# Patient Record
Sex: Male | Born: 1975 | Race: Black or African American | Hispanic: No | Smoking: Never smoker
Health system: Southern US, Community
[De-identification: ages and names within clinical notes are randomized; demographics above are authoritative.]

## PROBLEM LIST (undated history)

## (undated) DIAGNOSIS — S62101A Fracture of unspecified carpal bone, right wrist, initial encounter for closed fracture: Secondary | ICD-10-CM

## (undated) DIAGNOSIS — J309 Allergic rhinitis, unspecified: Secondary | ICD-10-CM

## (undated) HISTORY — PX: MANDIBLE FRACTURE SURGERY: SHX706

---

## 2014-03-19 ENCOUNTER — Emergency Department (HOSPITAL_COMMUNITY): Payer: Self-pay

## 2014-03-19 ENCOUNTER — Emergency Department (HOSPITAL_COMMUNITY): Payer: Medicaid Other | Admitting: Anesthesiology

## 2014-03-19 ENCOUNTER — Inpatient Hospital Stay (HOSPITAL_COMMUNITY)
Admission: EM | Admit: 2014-03-19 | Discharge: 2014-03-25 | DRG: 482 | Disposition: A | Discharge status: Home Health | Payer: Medicaid Other | Attending: Orthopedic Surgery | Admitting: Orthopedic Surgery

## 2014-03-19 ENCOUNTER — Emergency Department (HOSPITAL_COMMUNITY): Payer: Medicaid Other

## 2014-03-19 ENCOUNTER — Encounter (HOSPITAL_COMMUNITY)
Admission: EM | Disposition: A | Discharge status: Home Health | Payer: Self-pay | Source: Home / Self Care | Attending: Orthopedic Surgery

## 2014-03-19 ENCOUNTER — Encounter (HOSPITAL_COMMUNITY): Payer: Medicaid Other | Admitting: Anesthesiology

## 2014-03-19 ENCOUNTER — Encounter (HOSPITAL_COMMUNITY): Payer: Self-pay | Admitting: Emergency Medicine

## 2014-03-19 DIAGNOSIS — S7291XA Unspecified fracture of right femur, initial encounter for closed fracture: Secondary | ICD-10-CM

## 2014-03-19 DIAGNOSIS — S72409A Unspecified fracture of lower end of unspecified femur, initial encounter for closed fracture: Secondary | ICD-10-CM | POA: Diagnosis present

## 2014-03-19 DIAGNOSIS — S72453A Displaced supracondylar fracture without intracondylar extension of lower end of unspecified femur, initial encounter for closed fracture: Secondary | ICD-10-CM | POA: Diagnosis present

## 2014-03-19 DIAGNOSIS — S7290XA Unspecified fracture of unspecified femur, initial encounter for closed fracture: Secondary | ICD-10-CM | POA: Diagnosis present

## 2014-03-19 DIAGNOSIS — Y9269 Other specified industrial and construction area as the place of occurrence of the external cause: Secondary | ICD-10-CM | POA: Diagnosis not present

## 2014-03-19 DIAGNOSIS — S7710XA Crushing injury of unspecified thigh, initial encounter: Secondary | ICD-10-CM

## 2014-03-19 DIAGNOSIS — S8700XA Crushing injury of unspecified knee, initial encounter: Secondary | ICD-10-CM

## 2014-03-19 DIAGNOSIS — W230XXA Caught, crushed, jammed, or pinched between moving objects, initial encounter: Secondary | ICD-10-CM | POA: Diagnosis present

## 2014-03-19 DIAGNOSIS — S7292XA Unspecified fracture of left femur, initial encounter for closed fracture: Secondary | ICD-10-CM

## 2014-03-19 HISTORY — DX: Allergic rhinitis, unspecified: J30.9

## 2014-03-19 HISTORY — PX: FEMUR IM NAIL: SHX1597

## 2014-03-19 HISTORY — DX: Fracture of unspecified carpal bone, right wrist, initial encounter for closed fracture: S62.101A

## 2014-03-19 LAB — URINALYSIS, ROUTINE W REFLEX MICROSCOPIC
Bilirubin Urine: NEGATIVE
Glucose, UA: NEGATIVE mg/dL
Hgb urine dipstick: NEGATIVE
KETONES UR: 15 mg/dL — AB
NITRITE: NEGATIVE
PH: 5 (ref 5.0–8.0)
PROTEIN: NEGATIVE mg/dL
Specific Gravity, Urine: 1.028 (ref 1.005–1.030)
UROBILINOGEN UA: 0.2 mg/dL (ref 0.0–1.0)

## 2014-03-19 LAB — ABO/RH: ABO/RH(D): A POS

## 2014-03-19 LAB — URINE MICROSCOPIC-ADD ON

## 2014-03-19 LAB — COMPREHENSIVE METABOLIC PANEL
ALT: 33 U/L (ref 0–53)
ANION GAP: 22 — AB (ref 5–15)
AST: 28 U/L (ref 0–37)
Albumin: 4 g/dL (ref 3.5–5.2)
Alkaline Phosphatase: 60 U/L (ref 39–117)
BUN: 11 mg/dL (ref 6–23)
CALCIUM: 9.4 mg/dL (ref 8.4–10.5)
CO2: 17 meq/L — AB (ref 19–32)
Chloride: 100 mEq/L (ref 96–112)
Creatinine, Ser: 1.06 mg/dL (ref 0.50–1.35)
GFR calc Af Amer: >90 mL/min (ref >90)
GFR, EST NON AFRICAN AMERICAN: 87 mL/min — AB (ref >90)
Glucose, Bld: 142 mg/dL — ABNORMAL HIGH (ref 70–99)
Potassium: 3.1 mEq/L — ABNORMAL LOW (ref 3.7–5.3)
Sodium: 139 mEq/L (ref 137–147)
Total Bilirubin: 0.5 mg/dL (ref 0.3–1.2)
Total Protein: 7.6 g/dL (ref 6.0–8.3)

## 2014-03-19 LAB — CDS SEROLOGY

## 2014-03-19 LAB — CBC
HCT: 41.5 % (ref 39.0–52.0)
Hemoglobin: 15 g/dL (ref 13.0–17.0)
MCH: 31.3 pg (ref 26.0–34.0)
MCHC: 36.1 g/dL — ABNORMAL HIGH (ref 30.0–36.0)
MCV: 86.5 fL (ref 78.0–100.0)
PLATELETS: 265 10*3/uL (ref 150–400)
RBC: 4.8 MIL/uL (ref 4.22–5.81)
RDW: 13.2 % (ref 11.5–15.5)
WBC: 8.9 10*3/uL (ref 4.0–10.5)

## 2014-03-19 LAB — PROTIME-INR
INR: 1.06 (ref 0.00–1.49)
PROTHROMBIN TIME: 13.8 s (ref 11.6–15.2)

## 2014-03-19 LAB — TYPE AND SCREEN
ABO/RH(D): A POS
Antibody Screen: NEGATIVE

## 2014-03-19 LAB — ETHANOL: Alcohol, Ethyl (B): <11 mg/dL (ref 0–11)

## 2014-03-19 LAB — GLUCOSE, CAPILLARY: Glucose-Capillary: 119 mg/dL — ABNORMAL HIGH (ref 70–99)

## 2014-03-19 SURGERY — INSERTION, INTRAMEDULLARY ROD, FEMUR, RETROGRADE
Anesthesia: General | Site: Leg Upper | Laterality: Bilateral

## 2014-03-19 MED ORDER — ARTIFICIAL TEARS OP OINT
TOPICAL_OINTMENT | OPHTHALMIC | Status: DC | PRN
  Administered 2014-03-19: 1 via OPHTHALMIC

## 2014-03-19 MED ORDER — MEPERIDINE HCL 25 MG/ML IJ SOLN: 6.2500 mg | INTRAMUSCULAR | Status: DC | PRN

## 2014-03-19 MED ORDER — GLYCOPYRROLATE 0.2 MG/ML IJ SOLN
INTRAMUSCULAR | Status: DC | PRN
Start: 2014-03-19 — End: 2014-03-19
  Administered 2014-03-19: .8 mg via INTRAVENOUS

## 2014-03-19 MED ORDER — FENTANYL CITRATE 0.05 MG/ML IJ SOLN: 100.0000 ug | Freq: Once | INTRAMUSCULAR | Status: DC

## 2014-03-19 MED ORDER — FENTANYL CITRATE 0.05 MG/ML IJ SOLN
INTRAMUSCULAR | Status: AC
  Filled 2014-03-19: qty 5

## 2014-03-19 MED ORDER — ONDANSETRON HCL 4 MG/2ML IJ SOLN
INTRAMUSCULAR | Status: DC | PRN
  Administered 2014-03-19: 4 mg via INTRAVENOUS

## 2014-03-19 MED ORDER — MORPHINE SULFATE 2 MG/ML IJ SOLN
INTRAMUSCULAR | Status: AC
  Filled 2014-03-19: qty 2

## 2014-03-19 MED ORDER — MIDAZOLAM HCL 5 MG/5ML IJ SOLN
INTRAMUSCULAR | Status: DC | PRN
  Administered 2014-03-19: 2 mg via INTRAVENOUS

## 2014-03-19 MED ORDER — ROCURONIUM BROMIDE 50 MG/5ML IV SOLN
INTRAVENOUS | Status: AC
  Filled 2014-03-19: qty 1

## 2014-03-19 MED ORDER — ONDANSETRON HCL 4 MG/2ML IJ SOLN
INTRAMUSCULAR | Status: AC
  Filled 2014-03-19: qty 2

## 2014-03-19 MED ORDER — ROCURONIUM BROMIDE 50 MG/5ML IV SOLN
INTRAVENOUS | Status: AC
  Filled 2014-03-19: qty 2

## 2014-03-19 MED ORDER — ONDANSETRON HCL 4 MG PO TABS: 4.0000 mg | ORAL_TABLET | Freq: Four times a day (QID) | ORAL | Status: DC | PRN

## 2014-03-19 MED ORDER — SENNA 8.6 MG PO TABS
1.0000 | ORAL_TABLET | Freq: Two times a day (BID) | ORAL | Status: DC
  Administered 2014-03-19 – 2014-03-23 (×9): 8.6 mg via ORAL
  Filled 2014-03-19 (×11): qty 1

## 2014-03-19 MED ORDER — DOCUSATE SODIUM 100 MG PO CAPS
100.0000 mg | ORAL_CAPSULE | Freq: Two times a day (BID) | ORAL | Status: DC
  Administered 2014-03-19 – 2014-03-25 (×12): 100 mg via ORAL
  Filled 2014-03-19 (×13): qty 1

## 2014-03-19 MED ORDER — HYDROMORPHONE HCL PF 1 MG/ML IJ SOLN: 0.2500 mg | INTRAMUSCULAR | Status: DC | PRN

## 2014-03-19 MED ORDER — PROPOFOL 10 MG/ML IV BOLUS
INTRAVENOUS | Status: AC
  Filled 2014-03-19: qty 20

## 2014-03-19 MED ORDER — MIDAZOLAM HCL 2 MG/2ML IJ SOLN
INTRAMUSCULAR | Status: AC
  Filled 2014-03-19: qty 2

## 2014-03-19 MED ORDER — 0.9 % SODIUM CHLORIDE (POUR BTL) OPTIME
TOPICAL | Status: DC | PRN
  Administered 2014-03-19: 1000 mL

## 2014-03-19 MED ORDER — MORPHINE SULFATE 2 MG/ML IJ SOLN
INTRAMUSCULAR | Status: AC
  Filled 2014-03-19: qty 1

## 2014-03-19 MED ORDER — OXYCODONE HCL 5 MG PO TABS: 5.0000 mg | ORAL_TABLET | Freq: Once | ORAL | Status: DC | PRN

## 2014-03-19 MED ORDER — ENOXAPARIN SODIUM 40 MG/0.4ML ~~LOC~~ SOLN
40.0000 mg | SUBCUTANEOUS | Status: DC
  Administered 2014-03-20 – 2014-03-25 (×6): 40 mg via SUBCUTANEOUS
  Filled 2014-03-19 (×8): qty 0.4

## 2014-03-19 MED ORDER — FENTANYL CITRATE 0.05 MG/ML IJ SOLN
50.0000 ug | Freq: Once | INTRAMUSCULAR | Status: AC
  Administered 2014-03-19: 50 ug via INTRAVENOUS
  Filled 2014-03-19: qty 1

## 2014-03-19 MED ORDER — FENTANYL CITRATE 0.05 MG/ML IJ SOLN
50.0000 ug | Freq: Once | INTRAMUSCULAR | Status: AC
  Administered 2014-03-19: 50 ug via INTRAVENOUS

## 2014-03-19 MED ORDER — OXYCODONE HCL 5 MG/5ML PO SOLN: 5.0000 mg | Freq: Once | ORAL | Status: DC | PRN

## 2014-03-19 MED ORDER — HYDROMORPHONE HCL PF 1 MG/ML IJ SOLN
0.5000 mg | INTRAMUSCULAR | Status: DC | PRN
  Administered 2014-03-19 – 2014-03-24 (×19): 1 mg via INTRAVENOUS
  Filled 2014-03-19 (×19): qty 1

## 2014-03-19 MED ORDER — CEFAZOLIN SODIUM-DEXTROSE 2-3 GM-% IV SOLR
INTRAVENOUS | Status: AC
  Filled 2014-03-19: qty 50

## 2014-03-19 MED ORDER — INSULIN ASPART 100 UNIT/ML ~~LOC~~ SOLN: 0.0000 [IU] | Freq: Three times a day (TID) | SUBCUTANEOUS | Status: DC

## 2014-03-19 MED ORDER — MORPHINE SULFATE 2 MG/ML IJ SOLN
INTRAMUSCULAR | Status: AC | PRN
  Administered 2014-03-19: 4 mg via INTRAVENOUS
  Administered 2014-03-19: 2 mg via INTRAVENOUS
  Administered 2014-03-19 (×3): 4 mg via INTRAVENOUS

## 2014-03-19 MED ORDER — DEXAMETHASONE SODIUM PHOSPHATE 4 MG/ML IJ SOLN
INTRAMUSCULAR | Status: DC | PRN
  Administered 2014-03-19: 4 mg via INTRAVENOUS

## 2014-03-19 MED ORDER — FENTANYL CITRATE 0.05 MG/ML IJ SOLN
INTRAMUSCULAR | Status: AC
Start: 2014-03-19 — End: 2014-03-19
  Filled 2014-03-19: qty 5

## 2014-03-19 MED ORDER — SUCCINYLCHOLINE CHLORIDE 20 MG/ML IJ SOLN
INTRAMUSCULAR | Status: DC | PRN
  Administered 2014-03-19: 140 mg via INTRAVENOUS

## 2014-03-19 MED ORDER — ONDANSETRON HCL 4 MG/2ML IJ SOLN
4.0000 mg | Freq: Four times a day (QID) | INTRAMUSCULAR | Status: DC | PRN
Start: 2014-03-19 — End: 2014-03-25

## 2014-03-19 MED ORDER — ROCURONIUM BROMIDE 100 MG/10ML IV SOLN
INTRAVENOUS | Status: DC | PRN
  Administered 2014-03-19: 10 mg via INTRAVENOUS
  Administered 2014-03-19: 50 mg via INTRAVENOUS
  Administered 2014-03-19: 10 mg via INTRAVENOUS
  Administered 2014-03-19: 50 mg via INTRAVENOUS

## 2014-03-19 MED ORDER — LACTATED RINGERS IV SOLN
INTRAVENOUS | Status: DC
  Administered 2014-03-19: 14:00:00 via INTRAVENOUS

## 2014-03-19 MED ORDER — LACTATED RINGERS IV SOLN
INTRAVENOUS | Status: DC | PRN
  Administered 2014-03-19 (×3): via INTRAVENOUS

## 2014-03-19 MED ORDER — PROMETHAZINE HCL 25 MG/ML IJ SOLN
6.2500 mg | INTRAMUSCULAR | Status: DC | PRN
  Administered 2014-03-19: 6.25 mg via INTRAVENOUS

## 2014-03-19 MED ORDER — SODIUM CHLORIDE 0.9 % IV SOLN
INTRAVENOUS | Status: DC
  Administered 2014-03-19 – 2014-03-20 (×2): via INTRAVENOUS

## 2014-03-19 MED ORDER — HYDROMORPHONE HCL PF 1 MG/ML IJ SOLN
INTRAMUSCULAR | Status: AC
  Filled 2014-03-19: qty 1

## 2014-03-19 MED ORDER — NEOSTIGMINE METHYLSULFATE 10 MG/10ML IV SOLN
INTRAVENOUS | Status: DC | PRN
  Administered 2014-03-19: 5 mg via INTRAVENOUS

## 2014-03-19 MED ORDER — PROPOFOL 10 MG/ML IV BOLUS
INTRAVENOUS | Status: DC | PRN
  Administered 2014-03-19: 160 mg via INTRAVENOUS

## 2014-03-19 MED ORDER — CEFAZOLIN SODIUM-DEXTROSE 2-3 GM-% IV SOLR
INTRAVENOUS | Status: DC | PRN
  Administered 2014-03-19: 2 g via INTRAVENOUS

## 2014-03-19 MED ORDER — LIDOCAINE HCL (CARDIAC) 20 MG/ML IV SOLN
INTRAVENOUS | Status: DC | PRN
  Administered 2014-03-19: 60 mg via INTRAVENOUS

## 2014-03-19 MED ORDER — STERILE WATER FOR IRRIGATION IR SOLN
Status: DC | PRN
  Administered 2014-03-19: 1000 mL

## 2014-03-19 MED ORDER — SODIUM CHLORIDE 0.9 % IV BOLUS (SEPSIS)
125.0000 mL | Freq: Once | INTRAVENOUS | Status: AC
  Administered 2014-03-19: 125 mL via INTRAVENOUS

## 2014-03-19 MED ORDER — PHENYLEPHRINE HCL 10 MG/ML IJ SOLN
10.0000 mg | INTRAVENOUS | Status: DC | PRN
  Administered 2014-03-19: 15 ug/min via INTRAVENOUS

## 2014-03-19 MED ORDER — ONDANSETRON HCL 4 MG/2ML IJ SOLN
4.0000 mg | Freq: Once | INTRAMUSCULAR | Status: AC
  Administered 2014-03-19: 4 mg via INTRAVENOUS

## 2014-03-19 MED ORDER — PROMETHAZINE HCL 25 MG/ML IJ SOLN
INTRAMUSCULAR | Status: AC
  Filled 2014-03-19: qty 1

## 2014-03-19 MED ORDER — METOCLOPRAMIDE HCL 10 MG PO TABS: 5.0000 mg | ORAL_TABLET | Freq: Three times a day (TID) | ORAL | Status: DC | PRN

## 2014-03-19 MED ORDER — PHENYLEPHRINE HCL 10 MG/ML IJ SOLN
INTRAMUSCULAR | Status: DC | PRN
  Administered 2014-03-19: 160 ug via INTRAVENOUS
  Administered 2014-03-19 (×2): 120 ug via INTRAVENOUS

## 2014-03-19 MED ORDER — OXYCODONE HCL 5 MG PO TABS
5.0000 mg | ORAL_TABLET | ORAL | Status: DC | PRN
  Administered 2014-03-20 – 2014-03-25 (×24): 10 mg via ORAL
  Filled 2014-03-19 (×25): qty 2

## 2014-03-19 MED ORDER — FENTANYL CITRATE 0.05 MG/ML IJ SOLN
INTRAMUSCULAR | Status: DC | PRN
  Administered 2014-03-19: 100 ug via INTRAVENOUS
  Administered 2014-03-19: 50 ug via INTRAVENOUS
  Administered 2014-03-19: 100 ug via INTRAVENOUS
  Administered 2014-03-19: 50 ug via INTRAVENOUS

## 2014-03-19 MED ORDER — MIDAZOLAM HCL 2 MG/2ML IJ SOLN: 0.5000 mg | Freq: Once | INTRAMUSCULAR | Status: DC | PRN

## 2014-03-19 MED ORDER — METOCLOPRAMIDE HCL 5 MG/ML IJ SOLN: 5.0000 mg | Freq: Three times a day (TID) | INTRAMUSCULAR | Status: DC | PRN

## 2014-03-19 MED ORDER — FENTANYL CITRATE 0.05 MG/ML IJ SOLN
INTRAMUSCULAR | Status: AC
  Filled 2014-03-19: qty 2

## 2014-03-19 SURGICAL SUPPLY — 61 items
BANDAGE ELASTIC 4 VELCRO ST LF (GAUZE/BANDAGES/DRESSINGS) IMPLANT
BANDAGE ESMARK 6X9 LF (GAUZE/BANDAGES/DRESSINGS) IMPLANT
BIT DRILL CALIBRATED 4.3MMX365 (DRILL) ×1 IMPLANT
BIT DRILL CROWE PNT TWST 4.5MM (DRILL) ×1 IMPLANT
BNDG COHESIVE 4X5 TAN STRL (GAUZE/BANDAGES/DRESSINGS) IMPLANT
BNDG COHESIVE 6X5 TAN STRL LF (GAUZE/BANDAGES/DRESSINGS) IMPLANT
BNDG ELASTIC 6X10 VLCR STRL LF (GAUZE/BANDAGES/DRESSINGS) ×6 IMPLANT
BNDG ESMARK 6X9 LF (GAUZE/BANDAGES/DRESSINGS)
CUFF TOURNIQUET SINGLE 44IN (TOURNIQUET CUFF) IMPLANT
DRAPE C-ARM 42X72 X-RAY (DRAPES) ×3 IMPLANT
DRAPE INCISE IOBAN 66X45 STRL (DRAPES) ×3 IMPLANT
DRAPE ORTHO SPLIT 77X108 STRL (DRAPES) ×4
DRAPE SURG ORHT 6 SPLT 77X108 (DRAPES) ×2 IMPLANT
DRAPE U-SHAPE 47X51 STRL (DRAPES) ×3 IMPLANT
DRILL CALIBRATED 4.3MMX365 (DRILL) ×3
DRILL CROWE POINT TWIST 4.5MM (DRILL) ×3
DRSG ADAPTIC 3X8 NADH LF (GAUZE/BANDAGES/DRESSINGS) ×3 IMPLANT
DRSG MEPILEX BORDER 4X4 (GAUZE/BANDAGES/DRESSINGS) ×18 IMPLANT
DRSG PAD ABDOMINAL 8X10 ST (GAUZE/BANDAGES/DRESSINGS) ×3 IMPLANT
ELECT REM PT RETURN 9FT ADLT (ELECTROSURGICAL) ×3
ELECTRODE REM PT RTRN 9FT ADLT (ELECTROSURGICAL) ×1 IMPLANT
EVACUATOR 1/8 PVC DRAIN (DRAIN) IMPLANT
GAUZE SPONGE 4X4 12PLY STRL (GAUZE/BANDAGES/DRESSINGS) ×3 IMPLANT
GLOVE BIO SURGEON STRL SZ7 (GLOVE) ×6 IMPLANT
GLOVE BIO SURGEON STRL SZ8 (GLOVE) ×6 IMPLANT
GLOVE BIOGEL M 6.5 STRL (GLOVE) ×3 IMPLANT
GLOVE BIOGEL M STER SZ 6 (GLOVE) ×3 IMPLANT
GLOVE BIOGEL PI IND STRL 6.5 (GLOVE) ×1 IMPLANT
GLOVE BIOGEL PI IND STRL 7.5 (GLOVE) ×2 IMPLANT
GLOVE BIOGEL PI IND STRL 8 (GLOVE) ×1 IMPLANT
GLOVE BIOGEL PI INDICATOR 6.5 (GLOVE) ×2
GLOVE BIOGEL PI INDICATOR 7.5 (GLOVE) ×4
GLOVE BIOGEL PI INDICATOR 8 (GLOVE) ×2
GLOVE SKINSENSE NS SZ6.5 (GLOVE) ×10
GLOVE SKINSENSE STRL SZ6.5 (GLOVE) ×5 IMPLANT
GOWN STRL REUS W/ TWL LRG LVL3 (GOWN DISPOSABLE) ×3 IMPLANT
GOWN STRL REUS W/TWL LRG LVL3 (GOWN DISPOSABLE) ×6
GUIDEWIRE BEAD TIP (WIRE) ×3 IMPLANT
IMMOBILIZER KNEE 22 (SOFTGOODS) ×3 IMPLANT
IMMOBILIZER KNEE 22 UNIV (SOFTGOODS) ×3 IMPLANT
KIT BASIN OR (CUSTOM PROCEDURE TRAY) ×3 IMPLANT
KIT ROOM TURNOVER OR (KITS) ×3 IMPLANT
NAIL FEM RETRO 12X420 (Nail) ×6 IMPLANT
PACK ORTHO EXTREMITY (CUSTOM PROCEDURE TRAY) ×3 IMPLANT
PAD ARMBOARD 7.5X6 YLW CONV (MISCELLANEOUS) ×6 IMPLANT
SCREW CORT TI DBL LEAD 5X40 (Screw) ×6 IMPLANT
SCREW CORT TI DBL LEAD 5X70 (Screw) ×6 IMPLANT
SCREW CORT TI DBL LEAD 5X85 (Screw) ×9 IMPLANT
SCREW CORT TI DBL LEAD 5X90 (Screw) ×3 IMPLANT
SPONGE LAP 18X18 X RAY DECT (DISPOSABLE) ×6 IMPLANT
STAPLER VISISTAT 35W (STAPLE) ×6 IMPLANT
SUT VIC AB 0 CT1 27 (SUTURE)
SUT VIC AB 0 CT1 27XBRD ANBCTR (SUTURE) IMPLANT
SUT VIC AB 2-0 CT1 27 (SUTURE)
SUT VIC AB 2-0 CT1 TAPERPNT 27 (SUTURE) IMPLANT
TOWEL OR 17X24 6PK STRL BLUE (TOWEL DISPOSABLE) ×3 IMPLANT
TOWEL OR 17X26 10 PK STRL BLUE (TOWEL DISPOSABLE) ×3 IMPLANT
TUBE CONNECTING 12'X1/4 (SUCTIONS) ×1
TUBE CONNECTING 12X1/4 (SUCTIONS) ×2 IMPLANT
UNDERPAD 30X30 INCONTINENT (UNDERPADS AND DIAPERS) ×6 IMPLANT
YANKAUER SUCT BULB TIP NO VENT (SUCTIONS) ×3 IMPLANT

## 2014-03-19 NOTE — Transfer of Care (Signed)
Immediate Anesthesia Transfer of Care Note  Patient: Lee Perez  Procedure(s) Performed: Procedure(s): Bilateral Intermedullary RETROGRADE FEMORAL NAILING (Bilateral)  Patient Location: PACU  Anesthesia Type:General  Level of Consciousness: sedated  Airway & Oxygen Therapy: Patient Spontanous Breathing and Patient connected to nasal cannula oxygen  Post-op Assessment: Report given to PACU RN, Post -op Vital signs reviewed and stable and Patient moving all extremities X 4  Post vital signs: Reviewed and stable  Complications: No apparent anesthesia complications

## 2014-03-19 NOTE — Anesthesia Preprocedure Evaluation (Signed)

## 2014-03-19 NOTE — Anesthesia Postprocedure Evaluation (Signed)
  Anesthesia Post-op Note  Patient: Lee Perez  Procedure(s) Performed: Procedure(s): Bilateral Intermedullary RETROGRADE FEMORAL NAILING (Bilateral)  Patient Location: PACU  Anesthesia Type:General  Level of Consciousness: awake, alert , oriented and patient cooperative  Airway and Oxygen Therapy: Patient Spontanous Breathing  Post-op Pain: mild  Post-op Assessment: Post-op Vital signs reviewed, Patient's Cardiovascular Status Stable, Respiratory Function Stable, Patent Airway and No signs of Nausea or vomiting  Post-op Vital Signs: stable  Last Vitals:  Filed Vitals:   03/19/14 1830  BP:   Pulse: 74  Temp:   Resp: 23    Complications: No apparent anesthesia complications

## 2014-03-19 NOTE — ED Notes (Signed)
Assisted Malachi Bonds, NT with undressing pt ([facility] had already cut most of pt's clothes off because of injuries), placed in gown, on monitor, continuous pulse oximetry, blood pressure cuff and oxygen [place] (2L); Ida, NT performed manual blood pressure; pt's cut clothes were placed in a personal belongings bag and labeled with pt's stickers and placed on floor beside pt's work boots; pt's cell phone was placed on bedside table by pastoral care after contacting pt's family; Johnston Ebbs, RN, Lindie Spruce, MD (trauma), Noreene Larsson (Radiology Tech), Malachi Bonds, [place] and myself log rolled pt off LSB after Lindie Spruce, MD had cleared c-spine and then we all assisted Victorino Dike [facility]) with placement of bilateral leg tractions; I assisted Ida, NT with the insertion of a temperature foley cath 68F; pt given warm blankets by Particia Nearing, RN and is resting at this time
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
## 2014-03-19 NOTE — Brief Op Note (Signed)
03/19/2014  5:38 PM  PATIENT:  Lee Perez  38 y.o. male  PRE-OPERATIVE DIAGNOSIS:  Bilateral femur fractures  POST-OPERATIVE DIAGNOSIS:  Same  Procedure(s):  Open treatment of bilateral femur fracture with intramedullary nailing  SURGEON:  Toni Arthurs, MD  ASSISTANT:  Lucretia Kern, PA-C  ANESTHESIA:   General  EBL:  200 cc  TOURNIQUET:  n/a  COMPLICATIONS:  None apparent  DISPOSITION:  Extubated, awake and stable to recovery.  DICTATION ID:  [id number]
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
## 2014-03-19 NOTE — Progress Notes (Signed)
Orthopedic Tech Progress Note Patient Details:  Lee Perez 12-04-1975 [id number]  Musculoskeletal Traction Type of Traction: Gilberto Better Traction Traction Location: Bilateral hare traction     [name] 03/19/2014, 12:52 PM

## 2014-03-19 NOTE — Consult Note (Signed)
Pt with bilat femur fractures.  Agree with note above.  To OR for bilat IM nails.  The risks and benefits of the alternative treatment options have been discussed in detail.  The patient wishes to proceed with surgery and specifically understands risks of bleeding, infection, nerve damage, blood clots, need for additional surgery, amputation and death.

## 2014-03-19 NOTE — Procedures (Signed)
Four quadrant FAST examination performed at the bedside using [employer] [place] machine with printed pics Examination was negative for free fluid.    Epigastric   RUQ   LUQ   Pelvic  Marta Lamas. Gae Bon, MD, FACS [phone] Trauma Surgeon

## 2014-03-19 NOTE — ED Notes (Signed)
Pts family given all belongings prior to transporting to OR.

## 2014-03-19 NOTE — Progress Notes (Signed)
Patient asked chaplain to call his mom and his boss from his cell phone. Reached patient's sister at mother's number, she said family is on the way. Pt's boss, Lee Perez, also said he is "coming to see patient." Please page if needed.   [name] [phone]
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
## 2014-03-19 NOTE — Anesthesia Procedure Notes (Signed)
Procedure Name: Intubation Date/Time: 03/19/2014 2:49 PM Performed by: Orvilla Fus A Pre-anesthesia Checklist: Patient identified, Timeout performed, Emergency Drugs available, Suction available and Patient being monitored Patient Re-evaluated:Patient Re-evaluated prior to inductionOxygen Delivery Method: Circle system utilized Preoxygenation: Pre-oxygenation with 100% oxygen Intubation Type: IV induction, Rapid sequence and Cricoid Pressure applied Grade View: Grade I Tube type: Oral Tube size: 8.0 mm Number of attempts: 1 Airway Equipment and Method: Rigid stylet and Video-laryngoscopy Placement Confirmation: ETT inserted through vocal cords under direct vision,  breath sounds checked- equal and bilateral and positive ETCO2 Secured at: 23 cm Tube secured with: Tape Dental Injury: Teeth and Oropharynx as per pre-operative assessment  Comments: Small amount of emesis noted during intubation after balloon advanced through cords. Airway suctioned by Dr. Council Mechanic- no emesis found. OGT inserted post-intubation.
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
## 2014-03-20 LAB — GLUCOSE, CAPILLARY: GLUCOSE-CAPILLARY: 121 mg/dL — AB (ref 70–99)

## 2014-03-20 LAB — HEMOGLOBIN A1C
Hgb A1c MFr Bld: 5.6 % (ref <5.7)
Mean Plasma Glucose: 114 mg/dL (ref <117)

## 2014-03-20 MED ORDER — CELECOXIB 200 MG PO CAPS
200.0000 mg | ORAL_CAPSULE | Freq: Every day | ORAL | Status: DC
  Administered 2014-03-20 – 2014-03-25 (×6): 200 mg via ORAL
  Filled 2014-03-20 (×6): qty 1

## 2014-03-20 NOTE — Evaluation (Signed)
Physical Therapy Evaluation Patient Details Name: Lee Perez MRN: [id number] DOB: 12-Jan-1976 Today's Date: 03/20/2014   History of Present Illness  38 y.o. male s/p Bilateral Intermedullary RETROGRADE FEMORAL NAILING   Clinical Impression  Patient is seen following the above procedure and presents with functional limitations due to the deficits listed below (see PT Problem List). Highly motivated and willing to work with therapy today, tolerating bed mobility and transfer training via stand-pivot transfer to chair. Pt Min-Mod assist +2 with mobility assessed today. Patient will benefit from skilled PT to increase their independence and safety with mobility to allow discharge to the venue listed below.      Follow Up Recommendations CIR    Equipment Recommendations  Rolling walker with 5" wheels;3in1 (PT);Wheelchair (measurements PT);Wheelchair cushion (measurements PT) (Elevating leg rests)    Recommendations for Other Services Rehab consult     Precautions / Restrictions Precautions Precautions: Fall Precaution Comments: Knee immobilizer on Rt (per Dr. Victorino Dike verbal order) Required Braces or Orthoses: Knee Immobilizer - Right Restrictions Weight Bearing Restrictions: Yes RLE Weight Bearing: Touchdown weight bearing LLE Weight Bearing: Weight bearing as tolerated      Mobility  Bed Mobility Overal bed mobility: Needs Assistance;+2 for physical assistance Bed Mobility: Supine to Sit     Supine to sit: Mod assist;+2 for physical assistance;HOB elevated     General bed mobility comments: Mod assist +2 for Bil LE support out of bed. VC for technique. Pt able to use rails and pivot on bottom to pull self around and forward to edge of bed.  Transfers Overall transfer level: Needs assistance Equipment used: Rolling walker (2 wheeled) Transfers: Sit to/from [employer] Sit to Stand: +2 physical assistance;From elevated surface;Mod assist Stand pivot transfers:  Min assist;+2 physical assistance       General transfer comment: Mod assist +2 for boost to standing position. VC for hand placement and technique. Demonstrates good UE strength to assist to upright position. Min assist +2 for walker placement and positioning during pivot transfer to chair. Able to bear majority of weight through LLE although very painful.  With min assist for safe descent into chair by assisting RLE to maintain weight bearing status.  Ambulation/Gait                Stairs            Wheelchair Mobility    Modified Rankin (Stroke Patients Only)       Balance Overall balance assessment: Needs assistance Sitting-balance support: No upper extremity supported;Feet supported Sitting balance-Leahy Scale: Fair     Standing balance support: Bilateral upper extremity supported Standing balance-Leahy Scale: Poor                               Pertinent Vitals/Pain Pain Assessment: 0-10 Pain Score:  ("Better now" - after receiving pain med.) Pain Location: BIL LEs Pain Intervention(s): Limited activity within patient's tolerance;Monitored during session;Repositioned;Premedicated before session    Home Living Family/patient expects to be discharged to:: Inpatient rehab Living Arrangements: Spouse/significant other;Children             Home Equipment: None      Prior Function Level of Independence: Independent               Hand Dominance        Extremity/Trunk Assessment   Upper Extremity Assessment: Defer to OT evaluation  Lower Extremity Assessment: RLE deficits/detail;LLE deficits/detail RLE Deficits / Details: Decreased strength and ROM as expected post op - normal sensation bilaterally LLE Deficits / Details: Decreased strength and ROM as expected post op - normal sensation bilaterally     Communication   Communication: No difficulties  Cognition Arousal/Alertness: Awake/alert Behavior During  Therapy: WFL for tasks assessed/performed Overall Cognitive Status: Within Functional Limits for tasks assessed                      General Comments General comments (skin integrity, edema, etc.): Reviewed weight bearing status and safe mobility techniques for transfer.    Exercises General Exercises - Lower Extremity Ankle Circles/Pumps: AROM;Both;10 reps;Seated Quad Sets: Strengthening;Both;10 reps;Seated      Assessment/Plan    PT Assessment Patient needs continued PT services  PT Diagnosis Difficulty walking;Abnormality of gait;Acute pain   PT Problem List Decreased strength;Decreased range of motion;Decreased activity tolerance;Decreased balance;Decreased mobility;Decreased knowledge of use of DME;Decreased knowledge of precautions;Pain  PT Treatment Interventions DME instruction;Gait training;Functional mobility training;Therapeutic activities;Therapeutic exercise;Balance training;Neuromuscular re-education;Patient/family education;Modalities   PT Goals (Current goals can be found in the Care Plan section) Acute Rehab PT Goals Patient Stated Goal: None stated PT Goal Formulation: With patient Time For Goal Achievement: 03/27/14 Potential to Achieve Goals: Good    Frequency Min 5X/week   Barriers to discharge        Co-evaluation PT/OT/SLP Co-Evaluation/Treatment: Yes Reason for Co-Treatment: Complexity of the patient's impairments (multi-system involvement);For patient/therapist safety PT goals addressed during session: Mobility/safety with mobility;Balance;Proper use of DME;Strengthening/ROM         End of Session Equipment Utilized During Treatment: Gait belt;Right knee immobilizer Activity Tolerance: Patient limited by pain Patient left: in chair;with call bell/phone within reach;with family/visitor present Nurse Communication: Mobility status;Precautions;Weight bearing status         Time: 1610-9604 PT Time Calculation (min): 36 min   Charges:    PT Evaluation $Initial PT Evaluation Tier I: 1 Procedure PT Treatments $Therapeutic Activity: 8-22 mins   PT G Codes:         Lee Perez, [place] [phone]  Lee Perez 03/20/2014, 4:13 PM

## 2014-03-20 NOTE — Evaluation (Signed)
Occupational Therapy Evaluation Patient Details Name: Lee Perez MRN: [id number] DOB: 1975-12-03 Today's Date: 03/20/2014    History of Present Illness 38 y.o. male s/p Bilateral Intermedullary RETROGRADE FEMORAL NAILING    Clinical Impression   This 38 yo male admitted and underwent above presents to acute OT with decrease use of Bil LEs (R>L), decreased mobility, increased pain, decreased WB'ing RLE all affecting his ability to care for himself and his PLOF of Independent. He will benefit from acute OT with follow up on CIR to get to a Mod I level.    Follow Up Recommendations  CIR    Equipment Recommendations   (TBD at next venue)       Precautions / Restrictions Precautions Precautions: Fall Precaution Comments: Knee immobilizer on Rt (per Dr. Victorino Dike verbal order) Required Braces or Orthoses: Knee Immobilizer - Right Restrictions Weight Bearing Restrictions: Yes RLE Weight Bearing: Touchdown weight bearing LLE Weight Bearing: Weight bearing as tolerated      Mobility Bed Mobility Overal bed mobility: Needs Assistance;+2 for physical assistance Bed Mobility: Supine to Sit     Supine to sit: Mod assist;+2 for physical assistance;HOB elevated     General bed mobility comments: Mod assist +2 for Bil LE support out of bed. VC for technique. Pt able to use rails and pivot on bottom to pull self around and forward to edge of bed.  Transfers Overall transfer level: Needs assistance Equipment used: Rolling walker (2 wheeled) Transfers: Sit to/from [employer] Sit to Stand: +2 physical assistance;From elevated surface;Mod assist Stand pivot transfers: Min assist;+2 physical assistance       General transfer comment: Mod assist +2 for boost to standing position. VC for hand placement and technique. Demonstrates good UE strength to assist to upright position. Min assist +2 for walker placement and positioning during pivot transfer to chair. Able to bear  majority of weight through LLE although very painful.  With min assist for safe descent into chair by assisting RLE to maintain weight bearing status.    Balance Overall balance assessment: Needs assistance Sitting-balance support: No upper extremity supported;Feet supported Sitting balance-Leahy Scale: Fair     Standing balance support: Bilateral upper extremity supported Standing balance-Leahy Scale: Poor                              ADL Overall ADL's : Needs assistance/impaired Eating/Feeding: Independent;Sitting   Grooming: Set up;Sitting   Upper Body Bathing: Set up;Sitting   Lower Body Bathing: Total assistance (with +2 sit<>stand)   Upper Body Dressing : Set up;Sitting   Lower Body Dressing: Total assistance (with +2 sit<>stand)   Toilet Transfer: Moderate assistance;+2 for physical assistance;Stand-pivot;RW (bed (raised) to built up recliner)   Toileting- Architect and Hygiene: Total assistance (with total A +2 sit<stand)                         Pertinent Vitals/Pain Pain Assessment: 0-10 Pain Score:  ("better now" after receiving pain meds prior to our returning to his room from earlier) Pain Location: bil LEs Pain Intervention(s): Limited activity within patient's tolerance;Monitored during session;Repositioned;Premedicated before session     Hand Dominance Right   Extremity/Trunk Assessment Upper Extremity Assessment Upper Extremity Assessment: Overall WFL for tasks assessed   Lower Extremity Assessment Lower Extremity Assessment: RLE deficits/detail;LLE deficits/detail RLE Deficits / Details: Decreased strength and ROM as expected post op - normal sensation bilaterally RLE: Unable  to fully assess due to immobilization;Unable to fully assess due to pain LLE Deficits / Details: Decreased strength and ROM as expected post op - normal sensation bilaterally LLE: Unable to fully assess due to pain;Unable to fully assess due to  immobilization       Communication Communication Communication: No difficulties   Cognition Arousal/Alertness: Awake/alert Behavior During Therapy: WFL for tasks assessed/performed Overall Cognitive Status: Within Functional Limits for tasks assessed                        Exercises Exercises: General Lower Extremity          Home Living Family/patient expects to be discharged to:: Inpatient rehab Living Arrangements: Spouse/significant other;Children                           Home Equipment: None          Prior Functioning/Environment Level of Independence: Independent             OT Diagnosis: Generalized weakness;Acute pain   OT Problem List: Decreased range of motion;Decreased activity tolerance;Impaired balance (sitting and/or standing);Decreased knowledge of use of DME or AE;Pain   OT Treatment/Interventions: Self-care/ADL training;DME and/or AE instruction;Therapeutic activities;Patient/family education;Balance training    OT Goals(Current goals can be found in the care plan section) Acute Rehab OT Goals Patient Stated Goal: to rehab then home OT Goal Formulation: With patient Time For Goal Achievement: 03/27/14 Potential to Achieve Goals: Good  OT Frequency: Min 2X/week           Co-evaluation PT/OT/SLP Co-Evaluation/Treatment: Yes Reason for Co-Treatment: Complexity of the patient's impairments (multi-system involvement) PT goals addressed during session: Mobility/safety with mobility;Balance;Proper use of DME;Strengthening/ROM OT goals addressed during session: ADL's and self-care;Strengthening/ROM      End of Session Equipment Utilized During Treatment: Gait belt;Rolling walker;Right knee immobilizer Nurse Communication: Mobility status (NT)  Activity Tolerance: Patient tolerated treatment well Patient left: in chair;with call bell/phone within reach;with family/visitor present   Time: 1610-9604 OT Time Calculation  (min): 36 min Charges:  OT General Charges $OT Visit: 1 Procedure OT Evaluation $Initial OT Evaluation Tier I: 1 Procedure OT Treatments $Self Care/Home Management : 8-22 mins  Evette Georges [phone] 03/20/2014, 5:46 PM

## 2014-03-20 NOTE — Progress Notes (Signed)
Patient doing fine.  Bilateral pulses to feet, but left DP not palpable.  Left PT is strongly palpable, and both are palpable on the right.  This isolated injury will be cared for by ortho.  We will sign off.  No other injuries identified.  Marta Lamas. Gae Bon, MD, FACS [phone] Trauma Surgeon

## 2014-03-20 NOTE — Progress Notes (Signed)
Subjective: 1 Day Post-Op Procedure(s) (LRB): Bilateral Intermedullary RETROGRADE FEMORAL NAILING (Bilateral) Patient reports pain as moderate.  Slight difficulty urinating s/p foley removal.  No n/v.  Objective: Vital signs in last 24 hours: Temp:  [97.2 F (36.2 C)-100.1 F (37.8 C)] 98.3 F (36.8 C) (08/27 1340) Pulse Rate:  [74-106] 106 (08/27 1340) Resp:  [14-23] 16 (08/27 1340) BP: (118-137)/(61-85) 120/77 mmHg (08/27 1340) SpO2:  [98 %-100 %] 100 % (08/27 1340)  Intake/Output from previous day: 08/26 0701 - 08/27 0700 In: 3020 [P.O.:120; I.V.:2900] Out: 1930 [Urine:1730; Blood:200] Intake/Output this shift: Total I/O In: 480 [P.O.:480] Out: 450 [Urine:450]   Recent Labs  03/19/14 1210  HGB 15.0    Recent Labs  03/19/14 1210  WBC 8.9  RBC 4.80  HCT 41.5  PLT 265    Recent Labs  03/19/14 1210  NA 139  K 3.1*  CL 100  CO2 17*  BUN 11  CREATININE 1.06  GLUCOSE 142*  CALCIUM 9.4    Recent Labs  03/19/14 1210  INR 1.06    PE:  bilat LEs dressed and dry.  NVI at bilat LEs.  Assessment/Plan: 1 Day Post-Op Procedure(s) (LRB): Bilateral Intermedullary RETROGRADE FEMORAL NAILING (Bilateral) PT, OT, SW - CIR consult.  Continue WBAT for transfers on the L.  TTWB on the R.  Lee Perez 03/20/2014, 3:36 PM

## 2014-03-20 NOTE — Progress Notes (Signed)
Utilization review completed.  
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
## 2014-03-20 NOTE — Progress Notes (Signed)
Rehab Admissions Coordinator Note:  Patient was screened by Clois Dupes for appropriateness for an Inpatient Acute Rehab Consult per PT recommendation. At this time, we are recommending Inpatient Rehab consult.  Clois Dupes 03/20/2014, 4:23 PM  I can be reached at [phone].

## 2014-03-21 ENCOUNTER — Encounter (HOSPITAL_COMMUNITY): Payer: Self-pay | Admitting: Physical Medicine and Rehabilitation

## 2014-03-21 DIAGNOSIS — S72409A Unspecified fracture of lower end of unspecified femur, initial encounter for closed fracture: Secondary | ICD-10-CM
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
## 2014-03-21 NOTE — Progress Notes (Signed)
We will follow up Monday to clarify workers compensation issues vs self pay for possible admission to inpt rehab. [phone]

## 2014-03-21 NOTE — Progress Notes (Signed)
Physical Therapy Treatment Patient Details Name: Ji Feldner MRN: [id number] DOB: 13-Dec-1975 Today's Date: 03/21/2014    History of Present Illness 38 y.o. male s/p Bilateral Intermedullary RETROGRADE FEMORAL NAILING     PT Comments    Patient highly motivated and eager to get out of bed. Patient limited by pain and TWB at this time. Continue to recommend comprehensive inpatient rehab (CIR) for post-acute therapy needs.   Follow Up Recommendations  CIR     Equipment Recommendations  Rolling walker with 5" wheels;3in1 (PT);Wheelchair (measurements PT);Wheelchair cushion (measurements PT)    Recommendations for Other Services       Precautions / Restrictions Precautions Precautions: Fall Precaution Comments: Knee immobilizer on Rt (per Dr. Victorino Dike verbal order) Required Braces or Orthoses: Knee Immobilizer - Right Restrictions Weight Bearing Restrictions: Yes RLE Weight Bearing: Touchdown weight bearing LLE Weight Bearing: Partial weight bearing    Mobility  Bed Mobility Overal bed mobility: Needs Assistance Bed Mobility: Supine to Sit     Supine to sit: Mod assist     General bed mobility comments: Mod A with positioning of LEs. Patient with helicopter technique via long sitting. Use of rails to scoot forward  Transfers Overall transfer level: Needs assistance Equipment used: Rolling walker (2 wheeled)   Sit to Stand: +2 physical assistance;From elevated surface;Mod assist Stand pivot transfers: Min assist;+2 physical assistance       General transfer comment: Mod assist +2 for boost to standing position with bed elevated. VC for hand placement and technique. Demonstrates good UE strength to assist to upright position. Min assist +2 for walker placement and positioning during pivot transfer to chair. Able to bear majority of weight through LLE although very painful.  With min assist for safe descent into chair by assisting RLE to maintain weight bearing  status.  Ambulation/Gait                 Stairs            Wheelchair Mobility    Modified Rankin (Stroke Patients Only)       Balance   Sitting-balance support: Feet supported Sitting balance-Leahy Scale: Fair       Standing balance-Leahy Scale: Poor                      Cognition Arousal/Alertness: Awake/alert Behavior During Therapy: WFL for tasks assessed/performed Overall Cognitive Status: Within Functional Limits for tasks assessed                      Exercises      General Comments        Pertinent Vitals/Pain Pain Score: 9  Pain Location: bil LEs Pain Descriptors / Indicators: Aching;Grimacing Pain Intervention(s): Monitored during session;Repositioned    Home Living                      Prior Function            PT Goals (current goals can now be found in the care plan section) Progress towards PT goals: Progressing toward goals    Frequency  Min 5X/week    PT Plan Current plan remains appropriate    Co-evaluation             End of Session Equipment Utilized During Treatment: Gait belt;Right knee immobilizer Activity Tolerance: Patient limited by pain Patient left: in chair;with call bell/phone within reach;with family/visitor present     Time: 4098-1191 PT Time  Calculation (min): 16 min  Charges:  $Therapeutic Activity: 8-22 mins                    G Codes:      Fredrich Birks 03/21/2014, 10:50 AM 03/21/2014 Fredrich Birks PTA [phone] pager [phone] office

## 2014-03-21 NOTE — Progress Notes (Signed)
Subjective: 2 Days Post-Op Procedure(s) (LRB): Bilateral Intermedullary RETROGRADE FEMORAL NAILING (Bilateral) Patient reports pain as moderate.  He states that although he still has a moderate amount of pain, it is improved compared to yesterday. He has been up with PT, and tolerated that well. He has been eating and drinking normally. He has no new complaints at this time. He denies any new HA, CP, SOB, N/V, fever or chills.   Objective: Vital signs in last 24 hours: Temp:  [98.3 F (36.8 C)-99.3 F (37.4 C)] 99.1 F (37.3 C) (08/28 0528) Pulse Rate:  [106-128] 111 (08/28 0528) Resp:  [16] 16 (08/28 0528) BP: (120-129)/(67-77) 128/67 mmHg (08/28 0528) SpO2:  [97 %-100 %] 100 % (08/28 0528)  Intake/Output from previous day: 08/27 0701 - 08/28 0700 In: 720 [P.O.:720] Out: 800 [Urine:800] Intake/Output this shift:     Recent Labs  03/19/14 1210  HGB 15.0    Recent Labs  03/19/14 1210  WBC 8.9  RBC 4.80  HCT 41.5  PLT 265    Recent Labs  03/19/14 1210  NA 139  K 3.1*  CL 100  CO2 17*  BUN 11  CREATININE 1.06  GLUCOSE 142*  CALCIUM 9.4    Recent Labs  03/19/14 1210  INR 1.06    WD/WN african [place] male in nad, sitting upright in bed. A and O x4. Mood and affect are appropriate. EOMI. Respirations normal and unlabored. bilateral lower extremities in soft dressings. Dressings C/D/I. NV intact with brisk capillary refill and motor function of toes. Distal sensation intact bilaterally.   Assessment/Plan: 2 Days Post-Op Procedure(s) (LRB): Bilateral Intermedullary RETROGRADE FEMORAL NAILING (Bilateral) Up with therapy, TTWB on RLE, WBAT on LLE.  PO narcotics for pain management On Lovenox for DVT prophylaxis SW consult in place for possible d/c to rehab facility- will await their recommendations   Lee Dillehay HOWELLS 03/21/2014, 8:06 AM

## 2014-03-22 NOTE — Progress Notes (Signed)
Physical Therapy Treatment Patient Details Name: Lee Perez MRN: [id number] DOB: [birth year] Today's Date: 03/22/2014    History of Present Illness 38 y.o. male s/p Bilateral Intermedullary RETROGRADE FEMORAL NAILING     PT Comments    Mobility cont's to be limited by pain & WBing restriction however pt very motivated & willing to participate.  Requires +2 assist for bed mobility, & transfers.  Cont with current POC & d/c recommendation of CIR.    Follow Up Recommendations  CIR     Equipment Recommendations  Rolling walker with 5" wheels;3in1 (PT);Wheelchair (measurements PT);Wheelchair cushion (measurements PT)    Recommendations for Other Services Rehab consult     Precautions / Restrictions Precautions Precautions: Fall Precaution Comments: Knee immobilizer on Rt (per Dr. Victorino Dike verbal order) Required Braces or Orthoses: Knee Immobilizer - Right Restrictions RLE Weight Bearing: Touchdown weight bearing LLE Weight Bearing: Weight bearing as tolerated    Mobility  Bed Mobility Overal bed mobility: Needs Assistance Bed Mobility: Supine to Sit     Supine to sit: +2 for physical assistance;Mod assist     General bed mobility comments: (A) to lift shoulders/trunk to sitting upright, manage LE's, & use of draw pad to pivot hips around to EOB.  Cues for technique.  Pt uses rail to assist with sitting upright & to pivot hips  Transfers Overall transfer level: Needs assistance Equipment used: Rolling walker (2 wheeled) Transfers: Sit to/from [employer] Sit to Stand: +2 physical assistance;Mod assist;From elevated surface Stand pivot transfers: +2 safety/equipment;Mod assist       General transfer comment: cues for hand placement, intiiation, & technique.  (A) to power up to standing, stabilize RW, manage RW during pivot, & controlled descent.  Cues to increase LLE WBing to allow RW to be moved.  Very little WBing through LLE noted- relied heavily on UE's  to keep from [place] on LE's.    Ambulation/Gait                 Stairs            Wheelchair Mobility    Modified Rankin (Stroke Patients Only)       Balance                                    Cognition Arousal/Alertness: Awake/alert Behavior During Therapy: WFL for tasks assessed/performed Overall Cognitive Status: Within Functional Limits for tasks assessed                      Exercises      General Comments        Pertinent Vitals/Pain Pain Score: 7  Pain Location: bil LE's Pain Descriptors / Indicators: Aching;Grimacing Pain Intervention(s): Limited activity within patient's tolerance;Monitored during session;Repositioned;Patient requesting pain meds-RN notified    Home Living                      Prior Function            PT Goals (current goals can now be found in the care plan section) Acute Rehab PT Goals PT Goal Formulation: With patient Time For Goal Achievement: 03/27/14 Potential to Achieve Goals: Good Progress towards PT goals: Progressing toward goals    Frequency  Min 5X/week    PT Plan Current plan remains appropriate    Co-evaluation  End of Session Equipment Utilized During Treatment: Gait belt;Right knee immobilizer Activity Tolerance: Patient tolerated treatment well;Patient limited by pain Patient left: in chair;with call bell/phone within reach;with family/visitor present     Time: 8295-6213 PT Time Calculation (min): 18 min  Charges:  $Therapeutic Activity: 8-22 mins                    G Codes:      Lara Mulch 03/22/2014, 1:45 PM   Verdell Face, PTA [phone] 03/22/2014

## 2014-03-22 NOTE — Progress Notes (Signed)
Subjective: 3 Days Post-Op Procedure(s) (LRB): Bilateral Intermedullary RETROGRADE FEMORAL NAILING (Bilateral) Patient reports pain as moderate.  Notes pain slightly better today. Noting some tightness in his lower legs but denies any pain. Denies numbness/tingling, N/V, CP, SOB.   Objective: Vital signs in last 24 hours: Temp:  [98.1 F (36.7 C)-98.7 F (37.1 C)] 98.2 F (36.8 C) (08/29 0546) Pulse Rate:  [79-118] 101 (08/29 0546) Resp:  [14-18] 14 (08/28 1357) BP: (119-128)/(56-70) 123/56 mmHg (08/29 0546) SpO2:  [98 %-100 %] 99 % (08/29 0546)  Intake/Output from previous day: 08/28 0701 - 08/29 0700 In: 555 [P.O.:555] Out: 600 [Urine:600] Intake/Output this shift:     Recent Labs  03/19/14 1210  HGB 15.0    Recent Labs  03/19/14 1210  WBC 8.9  RBC 4.80  HCT 41.5  PLT 265    Recent Labs  03/19/14 1210  NA 139  K 3.1*  CL 100  CO2 17*  BUN 11  CREATININE 1.06  GLUCOSE 142*  CALCIUM 9.4    Recent Labs  03/19/14 1210  INR 1.06    Neurologically intact ABD soft Neurovascular intact Sensation intact distally Intact pulses distally Dorsiflexion/Plantar flexion intact Incision: dressing C/D/I and no drainage No cellulitis present Compartment soft no calf pain or sign of DVT  Assessment/Plan: 3 Days Post-Op Procedure(s) (LRB): Bilateral Intermedullary RETROGRADE FEMORAL NAILING (Bilateral) Advance diet Up with therapy D/C IV fluids Continue WBAT LLE, TTWB RLE Continue Lovenox for DVT ppx Pending SW consult for disposition Likely D/C Monday, possibly to rehab  Lee Perez M. 03/22/2014, 8:30 AM

## 2014-03-23 NOTE — Progress Notes (Signed)
Subjective: 4 Days Post-Op Procedure(s) (LRB): Bilateral Intermedullary RETROGRADE FEMORAL NAILING (Bilateral) Patient reports pain as 2 on 0-10 scale. Awaiting SNF.   Objective: Vital signs in last 24 hours: Temp:  [99 F (37.2 C)-100 F (37.8 C)] 99 F (37.2 C) (08/30 0507) Pulse Rate:  [97-114] 97 (08/30 0507) Resp:  [16-20] 16 (08/30 0507) BP: (112-122)/(60) 120/60 mmHg (08/30 0507) SpO2:  [97 %-100 %] 97 % (08/30 0507)  Intake/Output from previous day: 08/29 0701 - 08/30 0700 In: 1120 [P.O.:1120] Out: 350 [Urine:350] Intake/Output this shift:    No results found for this basename: HGB,  in the last 72 hours No results found for this basename: WBC, RBC, HCT, PLT,  in the last 72 hours No results found for this basename: NA, K, CL, CO2, BUN, CREATININE, GLUCOSE, CALCIUM,  in the last 72 hours No results found for this basename: LABPT, INR,  in the last 72 hours  Neurologically intact  Assessment/Plan: 4 Days Post-Op Procedure(s) (LRB): Bilateral Intermedullary RETROGRADE FEMORAL NAILING (Bilateral) Discharge to SNF  Lee Perez A 03/23/2014, 8:27 AM

## 2014-03-23 NOTE — Progress Notes (Signed)
Clinical Social Work Department BRIEF PSYCHOSOCIAL ASSESSMENT 03/23/2014  Patient:  Lee Perez, Lee Perez     Account Number:  [id number]     Admit date:  03/19/2014  Clinical Social Worker:  Rolinda Roan  Date/Time:  03/23/2014 02:46 PM  Referred by:  Physician  Date Referred:  03/21/2014 Referred for  SNF Placement   Other Referral:   Interview type:  Patient Other interview type:    PSYCHOSOCIAL DATA Living Status:  ALONE Admitted from facility:   Level of care:   Primary support name:  Patinia (girlfriend) Primary support relationship to patient:  FRIEND Degree of support available:   Patient identified Patinia as his girlfriend and primary support. Degree of support is unknown.    CURRENT CONCERNS  Other Concerns:    SOCIAL WORK ASSESSMENT / PLAN Clinical Social Worker (CSW) met with patient to discuss SNF placement as a back up plan to CIR. CSW introduced self and explained role for CSW department. Patient's daughter was asleep in chair at bedside. Patient reported that he was in an accident at work and his workman's comp claim will be the payer source. Per patient he lives alone in [place]. Patient reported that he plans on staying with his girlfriend for a little while after he finishes rehab. Patient is agreeable to SNF search as back up to CIR. [facility] prefers [facility] because [facility] is close to his house.   Assessment/plan status:  Psychosocial Support/Ongoing Assessment of Needs Other assessment/ plan:   Information/referral to community resources:   CSW gave patient SNF list and encouraged him review medicare.gov for ratings on SNF's.    PATIENT'S/FAMILY'S RESPONSE TO PLAN OF CARE: Patient thanked CSW for visit and assisting with placement process.

## 2014-03-23 NOTE — Progress Notes (Signed)
Clinical Social Work Department CLINICAL SOCIAL WORK PLACEMENT NOTE 03/23/2014  Patient:  Lee Perez, Lee Perez  Account Number:  [id number] Admit date:  03/19/2014  Clinical Social Worker:  Jetta Lout, Theresia Majors  Date/time:  03/23/2014 02:44 PM  Clinical Social Work is seeking post-discharge placement for this patient at the following level of care:   SKILLED NURSING   (*CSW will update this form in Epic as items are completed)   03/23/2014  Patient/family provided with [facility] Department of Clinical Social Work's list of facilities offering this level of care within the geographic area requested by the patient (or if unable, by the patient's family).  03/23/2014  Patient/family informed of their freedom to choose among providers that offer the needed level of care, that participate in Medicare, Medicaid or managed care program needed by the patient, have an available bed and are willing to accept the patient.  03/23/2014  Patient/family informed of [facility]' ownership interest in [facility], as well as of the fact that they are under no obligation to receive care at this facility.  PASARR submitted to EDS on 03/23/2014 PASARR number received on 03/23/2014  FL2 transmitted to all facilities in geographic area requested by pt/family on  03/23/2014 FL2 transmitted to all facilities within larger geographic area on   Patient informed that his/her managed care company has contracts with or will negotiate with  certain facilities, including the following:     Patient/family informed of bed offers received:   Patient chooses bed at  Physician recommends and patient chooses bed at    Patient to be transferred to  on   Patient to be transferred to facility by  Patient and family notified of transfer on  Name of family member notified:    The following physician request were entered in Epic:   Additional Comments:

## 2014-03-24 MED ORDER — OXYCODONE HCL 5 MG PO TABS: 5.0000 mg | ORAL_TABLET | ORAL | Status: DC | PRN

## 2014-03-24 MED ORDER — SENNA 8.6 MG PO TABS: 2.0000 | ORAL_TABLET | Freq: Two times a day (BID) | ORAL | Status: DC

## 2014-03-24 MED ORDER — SENNA 8.6 MG PO TABS
2.0000 | ORAL_TABLET | Freq: Two times a day (BID) | ORAL | Status: DC
  Administered 2014-03-24 – 2014-03-25 (×3): 17.2 mg via ORAL
  Filled 2014-03-24 (×4): qty 2

## 2014-03-24 MED ORDER — DSS 100 MG PO CAPS: 100.0000 mg | ORAL_CAPSULE | Freq: Two times a day (BID) | ORAL | Status: DC

## 2014-03-24 MED ORDER — MAGNESIUM CITRATE PO SOLN
0.5000 | Freq: Once | ORAL | Status: AC
  Administered 2014-03-24: 0.5 via ORAL
  Filled 2014-03-24: qty 296

## 2014-03-24 MED ORDER — ENOXAPARIN SODIUM 40 MG/0.4ML ~~LOC~~ SOLN: 40.0000 mg | SUBCUTANEOUS | Status: DC

## 2014-03-24 NOTE — Progress Notes (Signed)
Inpatient Rehabilitation  I have phoned  Orson Aloe [phone]) and his wife Rada Hay [phone]) 5 times and left voicemails requesting call back in order  to clarify whether this case is Workers Comp .  I have not received call backs.  Visited pt. just now.  Pt. Stated that his employer , Mr. Delford Field , and the insurance company rep are to visit pt. tomorrow at noon.  I will plan to be present for the meeting to discern status.  If this case will be covered by [employer], I will need to receive authorization for IP rehab admission.    Weldon Picking PT Inpatient Rehab Admissions Coordinator Cell [phone] Office [phone]

## 2014-03-24 NOTE — Progress Notes (Signed)
Inpatient Rehabilitation  I met with Mr. Nakanishi and his girlfriend Alwyn Ren at the bedside today to discuss his post acute rehab options.  He and Alwyn Ren prefer pt. to come to IP rehab for his initial rehab recovery phase.  I have provided informational booklets and answered their questions.  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  I spoke with Sharyn Lull at [employer] and she states she is the Network engineer and does not know anything about this case.  She provided me with the owner's wife's contact info [employer]  @  [phone]).  I have left a voice mail for Glenard Haring to give me a call back to determine if case will in fact be Worker's Comp and to seek authorization for IP rehab admit if so.  Pt. States he had left the job site to go back to office for supplies for  return to job site  when accident happened.  Will update acute team as quickly as I  have further information.  Please call if questions.  [facility] Admissions Coordinator Cell [phone] Office [phone]

## 2014-03-24 NOTE — Discharge Summary (Signed)
Physician Discharge Summary  Patient ID: Lee Perez MRN: [id number] DOB/AGE: 39-14-1977 39 y.o.  Admit date: 03/19/2014 Discharge date: 03/24/2014  Admission Diagnoses:  Bilateral femur fractures  Discharge Diagnoses:  Active Problems:   Closed fracture of femur, distal end s/p intramedullary nailing of bilateral femur fractures  Discharged Condition: stable  Hospital Course: The patient was admitted on 8/26 with bilateral femur fractures.  He was taken to the OR that day for intramedullary nailing of the fractures.  Post op he was treated by OT and PT, and SNF or CIR was recommended.  He is discharged today in stable condition.  He is WBAT on the left for transfers and TTWB on the right.  Consults: PT, OT  Significant Diagnostic Studies: radiology: xrays show bilat femoral shaft fractures  Treatments: therapies: PT and OT  Discharge Exam: Blood pressure 116/64, pulse 93, temperature 99.8 F (37.7 C), temperature source Oral, resp. rate 16, height  (1.88 m), weight 108.863 kg (240 lb), SpO2 94.00%. wn wd male in nad.  a and O x 4.  Mood and affect normal.  EOMI.  Resp unlabored.  B LE incisions dressed and dry.  Disposition: snf v. CIR  Discharge Instructions   Call MD / Call 911    Complete by:  As directed   If you experience chest pain or shortness of breath, CALL 911 and be transported to the hospital emergency room.  If you develope a fever above 101 F, pus (white drainage) or increased drainage or redness at the wound, or calf pain, call your surgeon's office.     Constipation Prevention    Complete by:  As directed   Drink plenty of fluids.  Prune juice may be helpful.  You may use a stool softener, such as Colace (over the counter) 100 mg twice a day.  Use MiraLax (over the counter) for constipation as needed.     Diet - low sodium heart healthy    Complete by:  As directed      Increase activity slowly as tolerated    Complete by:  As directed      Touch down  weight bearing    Complete by:  As directed   Laterality:  right  Extremity:  Lower     Weight bearing as tolerated    Complete by:  As directed   Laterality:  left  Extremity:  Lower  For transfers only            Medication List         DSS 100 MG Caps  Take 100 mg by mouth 2 (two) times daily.     enoxaparin 40 MG/0.4ML injection  Commonly known as:  LOVENOX  Inject 0.4 mLs (40 mg total) into the skin daily.     oxyCODONE 5 MG immediate release tablet  Commonly known as:  Oxy IR/ROXICODONE  Take 1-2 tablets (5-10 mg total) by mouth every 4 (four) hours as needed for moderate pain or severe pain.     senna 8.6 MG Tabs tablet  Commonly known as:  SENOKOT  Take 2 tablets (17.2 mg total) by mouth 2 (two) times daily.           Follow-up Information   Follow up with Kynsli Haapala, Jonny Ruiz, MD. Schedule an appointment as soon as possible for a visit in 2 weeks.   Specialty:  Orthopedic Surgery   Contact information:   [address] [place] [address] [phone]  SignedToni Arthurs 03/24/2014, 7:42 AM

## 2014-03-24 NOTE — Progress Notes (Signed)
Pt has had the entire bottle of mag citrate with no results. I also admin 2 senokot tabs and colace. I have offered a supp or fleets enema multiple times but Pt continues to refuse. He is passing flatus and has BS times 4. I have discussed with him the importance of having a BM and the complications of constipation but insists on no other interventions at this time. Will continue to monitor Pt and will offer a supp again before shift change.

## 2014-03-24 NOTE — Progress Notes (Signed)
Physical Therapy Treatment Patient Details Name: Lee Perez MRN: [id number] DOB: February 18, 1976 Today's Date: 03/24/2014    History of Present Illness 38 y.o. male s/p Bilateral Intermedullary RETROGRADE FEMORAL NAILING     PT Comments    Patient making some great progress today with therapy. ABle to take some pivotal hops to 3n1 then to recliner. Continue to recommend comprehensive inpatient rehab (CIR) for post-acute therapy needs.   Follow Up Recommendations  CIR     Equipment Recommendations  Rolling walker with 5" wheels;3in1 (PT);Wheelchair (measurements PT);Wheelchair cushion (measurements PT)    Recommendations for Other Services       Precautions / Restrictions Precautions Precautions: Fall Precaution Comments: Knee immobilizer on Rt (per Dr. Victorino Dike verbal order) Required Braces or Orthoses: Knee Immobilizer - Right Knee Immobilizer - Right: On at all times Restrictions Weight Bearing Restrictions: Yes RLE Weight Bearing: Touchdown weight bearing LLE Weight Bearing: Weight bearing as tolerated    Mobility  Bed Mobility               General bed mobility comments: Patient sitting EOB upon entering room  Transfers Overall transfer level: Needs assistance Equipment used: Rolling walker (2 wheeled)   Sit to Stand: Min assist;+2 physical assistance;+2 safety/equipment Stand pivot transfers: +2 safety/equipment;Mod assist       General transfer comment: Patient able to take pivotal hops with transfer this session. Able to maintain R NWB. Very good UE use on RW for hops. Cues for positioning to and from 3n1 and recliner.   Ambulation/Gait                 Stairs            Wheelchair Mobility    Modified Rankin (Stroke Patients Only)       Balance Overall balance assessment: Needs assistance Sitting-balance support: Feet supported;No upper extremity supported Sitting balance-Leahy Scale: Fair                               Cognition Arousal/Alertness: Awake/alert Behavior During Therapy: WFL for tasks assessed/performed Overall Cognitive Status: Within Functional Limits for tasks assessed                      Exercises      General Comments        Pertinent Vitals/Pain Pain Assessment: 0-10 Pain Score: 6  Pain Location: R leg Pain Descriptors / Indicators: Aching Pain Intervention(s): Monitored during session;Limited activity within patient's tolerance    Home Living                      Prior Function            PT Goals (current goals can now be found in the care plan section) Progress towards PT goals: Progressing toward goals    Frequency  Min 5X/week    PT Plan Current plan remains appropriate    Co-evaluation             End of Session Equipment Utilized During Treatment: Gait belt;Right knee immobilizer Activity Tolerance: Patient tolerated treatment well;Patient limited by pain Patient left: in chair;with call bell/phone within reach;with family/visitor present     Time: 1000-1029 PT Time Calculation (min): 29 min  Charges:  $Therapeutic Activity: 23-37 mins                    G Codes:  Fredrich Birks 03/24/2014, 12:57 PM 03/24/2014 Fredrich Birks PTA [phone] pager [phone] office

## 2014-03-24 NOTE — Progress Notes (Signed)
Occupational Therapy Treatment Patient Details Name: Lee Perez MRN: [id number] DOB: [birth year] Today's Date: 03/24/2014    History of present illness 38 y.o. male s/p Bilateral Intermedullary RETROGRADE FEMORAL NAILING    OT comments  This 38 yo male making progress with today's emphasis on use of AE for LBD. Pt will continue to benefit from OT with inpatient rehab giving him his best option for fastest way to [place]/Mod I.   Follow Up Recommendations  CIR    Equipment Recommendations   (TBD next venue)       Precautions / Restrictions Precautions Precautions: Fall Precaution Comments: Knee immobilizer on Rt (per Dr. Victorino Dike verbal order) Required Braces or Orthoses: Knee Immobilizer - Right Knee Immobilizer - Right: On at all times (Have called Dr. Laverta [place] office and asked if it can be off to start ROM) Restrictions Weight Bearing Restrictions: Yes RLE Weight Bearing: Touchdown weight bearing LLE Weight Bearing: Weight bearing as tolerated       Mobility   General transfer comment: Assist to hold legs to scoot forward and back in recliner    Balance Overall balance assessment: Needs assistance Sitting-balance support: Feet supported;No upper extremity supported Sitting balance-Leahy Scale: Fair                             ADL Overall ADL's : Needs assistance/impaired                     Lower Body Dressing: With adaptive equipment;Minimal assistance (to use AE to get socks off/on and shorts on/off over feet and pull up to thighs--did not do sit<>stand to pull up shorts)                 General ADL Comments: Pt c/o pain just above right heel, removed ace wrap to look at it and no outward sore noted (pt reports it is tender right at achilles tendon attachment point), re-wrapped leg with ace wrap and propped up Bil LEs so ankles "floating"                Cognition   Behavior During Therapy: WFL for tasks  assessed/performed Overall Cognitive Status: Within Functional Limits for tasks assessed                                    Pertinent Vitals/ Pain       Pain Assessment: 0-10 Pain Score: 6  Pain Location: R>L Pain Descriptors / Indicators: Aching;Pressure Pain Intervention(s): Limited activity within patient's tolerance (Assisted with leg where needed)         Frequency Min 2X/week     Progress Toward Goals  OT Goals(current goals can now be found in the care plan section)  Progress towards OT goals: Progressing toward goals     Plan Discharge plan remains appropriate       End of Session Equipment Utilized During Treatment:  (AE (long reacher and wide sock aide))   Activity Tolerance Patient tolerated treatment well   Patient Left in chair;with call bell/phone within reach;with family/visitor present   Nurse Communication  (Called and left Dr. Victorino Dike a message about starting ROM on right knee)        Time: 4098-1191 OT Time Calculation (min): 32 min  Charges: OT General Charges $OT Visit: 1 Procedure OT Treatments $Self Care/Home Management : 23-37 mins  Reina Fuse  Carley Hammed [phone] 03/24/2014, 12:32 PM

## 2014-03-24 NOTE — Discharge Instructions (Signed)
You may put your full weight on the left leg to transfer.  You can put your toe down on the floor with your right leg, but don't bear any weight on the right foot.

## 2014-03-24 NOTE — Progress Notes (Signed)
Pt had large BM at 1930. Pt up to the [facility] with 2 person assist. Pt still has supp order if needed.

## 2014-03-25 ENCOUNTER — Encounter (HOSPITAL_COMMUNITY): Payer: Self-pay | Admitting: *Deleted

## 2014-03-25 ENCOUNTER — Inpatient Hospital Stay (HOSPITAL_COMMUNITY)
Admission: RE | Admit: 2014-03-25 | Discharge: 2014-03-29 | DRG: 945 | Disposition: A | Discharge status: Home Health | Payer: Medicaid Other | Source: Intra-hospital | Attending: Physical Medicine & Rehabilitation | Admitting: Physical Medicine & Rehabilitation

## 2014-03-25 DIAGNOSIS — S72409A Unspecified fracture of lower end of unspecified femur, initial encounter for closed fracture: Secondary | ICD-10-CM

## 2014-03-25 DIAGNOSIS — Z9889 Other specified postprocedural states: Secondary | ICD-10-CM | POA: Diagnosis not present

## 2014-03-25 DIAGNOSIS — Z5189 Encounter for other specified aftercare: Principal | ICD-10-CM

## 2014-03-25 DIAGNOSIS — S72499A Other fracture of lower end of unspecified femur, initial encounter for closed fracture: Secondary | ICD-10-CM | POA: Diagnosis not present

## 2014-03-25 DIAGNOSIS — M25569 Pain in unspecified knee: Secondary | ICD-10-CM | POA: Diagnosis present

## 2014-03-25 DIAGNOSIS — D62 Acute posthemorrhagic anemia: Secondary | ICD-10-CM | POA: Diagnosis present

## 2014-03-25 DIAGNOSIS — W230XXA Caught, crushed, jammed, or pinched between moving objects, initial encounter: Secondary | ICD-10-CM

## 2014-03-25 DIAGNOSIS — S7290XA Unspecified fracture of unspecified femur, initial encounter for closed fracture: Secondary | ICD-10-CM | POA: Diagnosis present

## 2014-03-25 DIAGNOSIS — K5900 Constipation, unspecified: Secondary | ICD-10-CM | POA: Diagnosis not present

## 2014-03-25 DIAGNOSIS — Y990 Civilian activity done for income or pay: Secondary | ICD-10-CM | POA: Diagnosis not present

## 2014-03-25 DIAGNOSIS — Y9269 Other specified industrial and construction area as the place of occurrence of the external cause: Secondary | ICD-10-CM

## 2014-03-25 DIAGNOSIS — E876 Hypokalemia: Secondary | ICD-10-CM | POA: Diagnosis not present

## 2014-03-25 MED ORDER — ACETAMINOPHEN 325 MG PO TABS
325.0000 mg | ORAL_TABLET | ORAL | Status: DC | PRN
  Administered 2014-03-29: 650 mg via ORAL
  Filled 2014-03-25: qty 2

## 2014-03-25 MED ORDER — TRAMADOL HCL 50 MG PO TABS
50.0000 mg | ORAL_TABLET | Freq: Four times a day (QID) | ORAL | Status: DC | PRN
  Administered 2014-03-26 (×3): 50 mg via ORAL
  Filled 2014-03-25 (×3): qty 1

## 2014-03-25 MED ORDER — PROCHLORPERAZINE MALEATE 5 MG PO TABS
5.0000 mg | ORAL_TABLET | Freq: Four times a day (QID) | ORAL | Status: DC | PRN
  Filled 2014-03-25: qty 2

## 2014-03-25 MED ORDER — SENNOSIDES-DOCUSATE SODIUM 8.6-50 MG PO TABS
3.0000 | ORAL_TABLET | Freq: Two times a day (BID) | ORAL | Status: DC
  Administered 2014-03-25 – 2014-03-26 (×2): 3 via ORAL
  Filled 2014-03-25 (×2): qty 3

## 2014-03-25 MED ORDER — CELECOXIB 200 MG PO CAPS
200.0000 mg | ORAL_CAPSULE | Freq: Every day | ORAL | Status: DC
  Administered 2014-03-26 – 2014-03-29 (×4): 200 mg via ORAL
  Filled 2014-03-25 (×5): qty 1

## 2014-03-25 MED ORDER — GUAIFENESIN-DM 100-10 MG/5ML PO SYRP: 5.0000 mL | ORAL_SOLUTION | Freq: Four times a day (QID) | ORAL | Status: DC | PRN

## 2014-03-25 MED ORDER — PROCHLORPERAZINE EDISYLATE 5 MG/ML IJ SOLN
5.0000 mg | Freq: Four times a day (QID) | INTRAMUSCULAR | Status: DC | PRN
  Filled 2014-03-25: qty 2

## 2014-03-25 MED ORDER — OXYCODONE HCL 5 MG PO TABS
10.0000 mg | ORAL_TABLET | ORAL | Status: DC | PRN
  Administered 2014-03-25 – 2014-03-26 (×5): 10 mg via ORAL
  Filled 2014-03-25 (×5): qty 2

## 2014-03-25 MED ORDER — DOCUSATE SODIUM 100 MG PO CAPS
100.0000 mg | ORAL_CAPSULE | Freq: Two times a day (BID) | ORAL | Status: DC
  Filled 2014-03-25 (×2): qty 1

## 2014-03-25 MED ORDER — PROCHLORPERAZINE 25 MG RE SUPP
12.5000 mg | Freq: Four times a day (QID) | RECTAL | Status: DC | PRN
  Filled 2014-03-25: qty 1

## 2014-03-25 MED ORDER — BISACODYL 10 MG RE SUPP: 10.0000 mg | Freq: Every day | RECTAL | Status: DC | PRN

## 2014-03-25 MED ORDER — SENNA 8.6 MG PO TABS: 2.0000 | ORAL_TABLET | Freq: Three times a day (TID) | ORAL | Status: DC

## 2014-03-25 MED ORDER — TRAZODONE HCL 50 MG PO TABS
25.0000 mg | ORAL_TABLET | Freq: Every evening | ORAL | Status: DC | PRN
  Administered 2014-03-26: 50 mg via ORAL
  Filled 2014-03-25: qty 1

## 2014-03-25 MED ORDER — FLEET ENEMA 7-19 GM/118ML RE ENEM: 1.0000 | ENEMA | Freq: Once | RECTAL | Status: AC | PRN

## 2014-03-25 MED ORDER — ENOXAPARIN SODIUM 40 MG/0.4ML ~~LOC~~ SOLN
40.0000 mg | SUBCUTANEOUS | Status: DC
  Administered 2014-03-25 – 2014-03-28 (×4): 40 mg via SUBCUTANEOUS
  Filled 2014-03-25 (×5): qty 0.4

## 2014-03-25 MED ORDER — DIPHENHYDRAMINE HCL 12.5 MG/5ML PO ELIX
12.5000 mg | ORAL_SOLUTION | Freq: Four times a day (QID) | ORAL | Status: DC | PRN
  Administered 2014-03-27 – 2014-03-29 (×6): 25 mg via ORAL
  Filled 2014-03-25 (×6): qty 10

## 2014-03-25 MED ORDER — ONDANSETRON HCL 4 MG/2ML IJ SOLN: 4.0000 mg | Freq: Four times a day (QID) | INTRAMUSCULAR | Status: DC | PRN

## 2014-03-25 MED ORDER — ALUM & MAG HYDROXIDE-SIMETH 200-200-20 MG/5ML PO SUSP: 30.0000 mL | ORAL | Status: DC | PRN

## 2014-03-25 MED ORDER — ONDANSETRON HCL 4 MG PO TABS: 4.0000 mg | ORAL_TABLET | Freq: Four times a day (QID) | ORAL | Status: DC | PRN

## 2014-03-25 NOTE — Progress Notes (Signed)
Physical Therapy Treatment Patient Details Name: Lee Perez MRN: [id number] DOB: 1976-07-04 Today's Date: 03/25/2014    History of Present Illness 38 y.o. male s/p Bilateral Intermedullary RETROGRADE FEMORAL NAILING     PT Comments    Patient making great progress. Min A today overall. Continue to recommend comprehensive inpatient rehab (CIR) for post-acute therapy needs.   Follow Up Recommendations  CIR     Equipment Recommendations  Rolling walker with 5" wheels;3in1 (PT);Wheelchair (measurements PT);Wheelchair cushion (measurements PT)    Recommendations for Other Services       Precautions / Restrictions Precautions Precautions: Fall Precaution Comments: Knee immobilizer on Rt (per Dr. Victorino Dike verbal order) Required Braces or Orthoses: Knee Immobilizer - Right Knee Immobilizer - Right: On at all times Restrictions RLE Weight Bearing: Touchdown weight bearing LLE Weight Bearing: Weight bearing as tolerated Other Position/Activity Restrictions: transfers only    Mobility  Bed Mobility                  Transfers Overall transfer level: Needs assistance Equipment used: Rolling walker (2 wheeled)   Sit to Stand: Min assist Stand pivot transfers: Min assist       General transfer comment: Patient able to take pivotal hops with transfer this session. Able to maintain R NWB. Very good UE use on RW for hops. Stood with SPT x2 for review of safe technique and for strengthening  Ambulation/Gait                 Stairs            Wheelchair Mobility    Modified Rankin (Stroke Patients Only)       Balance                                    Cognition Arousal/Alertness: Awake/alert Behavior During Therapy: WFL for tasks assessed/performed Overall Cognitive Status: Within Functional Limits for tasks assessed                      Exercises General Exercises - Lower Extremity Quad Sets: Strengthening;Both;10  reps;Seated Heel Slides: AAROM;Both;10 reps Straight Leg Raises: AROM;Both;5 reps    General Comments        Pertinent Vitals/Pain Pain Score: 7  Pain Location: R leg Pain Descriptors / Indicators: Aching;Burning Pain Intervention(s): Monitored during session;Repositioned    Home Living                      Prior Function            PT Goals (current goals can now be found in the care plan section) Progress towards PT goals: Progressing toward goals    Frequency  Min 5X/week    PT Plan Current plan remains appropriate    Co-evaluation             End of Session Equipment Utilized During Treatment: Gait belt;Right knee immobilizer Activity Tolerance: Patient tolerated treatment well Patient left: in chair;with call bell/phone within reach;with family/visitor present     Time: 8657-8469 PT Time Calculation (min): 26 min  Charges:  $Therapeutic Exercise: 8-22 mins $Therapeutic Activity: 8-22 mins                    G Codes:      Fredrich Birks 03/25/2014, 1:36 PM 03/25/2014 Fredrich Birks PTA [phone] pager [phone] office

## 2014-03-25 NOTE — PMR Pre-admission (Signed)
PMR Admission Coordinator Pre-Admission Assessment  Patient: Lee Perez is an 38 y.o., male MRN: [id number] DOB: 01/19/1976 Height:  (188 cm) Weight: 108.863 kg (240 lb)              Insurance Information HMO:     PPO:      PCP:      IPA:      80/20:      OTHER:  PRIMARY: uninsured Pt aware he is admitting as uninsured     Policy#:       Subscriber:  Lee Perez [phone] and Lee Perez [phone], employer [employer]. 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 [employer] contacted pt and stated investigating the claim. [facility] Specialist [place], [phone]. I spoke with her 9/1 after her visit with pt. She states claim under investigation and she will not know for about one week.  Medicaid Application Date:       Case Manager:  Disability Application Date:       Case Worker:   Emergency Contact Information Contact Information   Name Relation Home Work Mobile   Lee Perez Significant other   [phone]   Idris, Lee Perez Mother [phone]       Current Medical History  Patient Admitting Diagnosis:bilateral distal femur fx's   History of Present Illness: HPI: Lee Perez is a 38 y.o. male who was admitted from work site on 03/19/14 past being struck by a tractor trailer that backed up and pinned him against a concrete wall.   Work up with bilateral acute comminuted fracture of the distal femoral diaphysis and patient underwent ORIF bilateral femurs by Dr. Victorino Dike on the same day. Post op TTWB on RLE and WBAT for transfers on LLE.   Past Medical History  Past Medical History  Diagnosis Date  . Multiple respiratory allergies   . Wrist fracture, right     as a teenager/casted    Family History  family history includes Diabetes in his mother.  Prior Rehab/Hospitalizations: none  Current Medications  Current facility-administered medications:celecoxib (CELEBREX) capsule 200 mg, 200 mg, Oral, Daily, Toni Arthurs,  MD, 200 mg at 03/25/14 1244;  docusate sodium (COLACE) capsule 100 mg, 100 mg, Oral, BID, Remo Lipps, [place], 100 mg at 03/25/14 1245;  enoxaparin (LOVENOX) injection 40 mg, 40 mg, Subcutaneous, Q24H, Remo Lipps, [place], 40 mg at 03/25/14 0803 HYDROmorphone (DILAUDID) injection 0.5-1 mg, 0.5-1 mg, Intravenous, Q1H PRN, Remo Lipps, PA, 1 mg at 03/24/14 1824;  ondansetron (ZOFRAN) injection 4 mg, 4 mg, Intravenous, Q6H PRN, Remo Lipps, PA;  ondansetron (ZOFRAN) tablet 4 mg, 4 mg, Oral, Q6H PRN, Remo Lipps, PA;  oxyCODONE (Oxy IR/ROXICODONE) immediate release tablet 5-10 mg, 5-10 mg, Oral, Q3H PRN, Remo Lipps, PA, 10 mg at 03/25/14 1245 senna (SENOKOT) tablet 17.2 mg, 2 tablet, Oral, BID, Toni Arthurs, MD, 17.2 mg at 03/25/14 1245  Patients Current Diet: General  Precautions / Restrictions Precautions Precautions: Fall Precaution Comments: Knee immobilizer on Rt (per Dr. 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 Was living at apartment with his 10 yo. This child is with his mother.  Home Assistive Devices / Equipment Home Assistive Devices/Equipment: None  Home Equipment: None  Prior Functional Level Prior Function Level of Independence: Independent  Current Functional Level Cognition  Overall Cognitive Status: Within Functional Limits for tasks assessed Orientation Level: Oriented X4    Extremity Assessment (includes Sensation/Coordination)          ADLs  Overall ADL's : Needs assistance/impaired Eating/Feeding: Independent;Sitting Grooming: Set up;Sitting Upper Body Bathing: Set  up;Sitting Lower Body Bathing: Total assistance (with +2 sit<>stand) Upper Body Dressing : Set up;Sitting Lower Body Dressing: With adaptive equipment;Minimal assistance (to use AE to get socks off/on and shorts on/off over feet and pull up to thighs--did not do sit<>stand to pull up shorts) Toilet Transfer: Moderate assistance;+2 for physical assistance;Stand-pivot;RW (bed (raised) to built up recliner) Toileting- Architect and Hygiene: Total assistance (with total A +2 sit<stand) General ADL Comments: Pt c/o pain just above right heel, removed ace wrap to look at it and no outward sore noted (pt reports it is tender right at achilles tendon attachment point), re-wrapped leg with ace wrap and propped up Bil LEs so ankles "floating"    Mobility  Overal bed mobility: Needs Assistance Bed Mobility: Supine to Sit Supine to sit: +2 for physical assistance;Mod assist General bed mobility comments: Patient sitting EOB upon entering room    Transfers  Overall transfer level: Needs assistance Equipment used: Rolling walker (2 wheeled) Transfers: Sit to/from [employer] Sit to Stand: Min assist Stand pivot transfers: Min assist General transfer comment: Patient able to take pivotal hops with transfer this session. Able to maintain R NWB. Very good UE use on RW for hops. Stood with SPT x2 for review of safe technique and for strengthening    Ambulation / Gait / Stairs / Engineer, drilling / Balance      Special needs/care consideration Bowel mgmt: BM 8/31 after Mag citrate, senokot and colace Bladder mgmt: continent    Previous Home Environment Living Arrangements: Parent;Other (Comment) (lived with Mom and 53 yo daughter)  Lives With: Family Available Help at Discharge: Family;Available 24 hours/day Type of Home: Apartment Home Layout: One level Home Access: Stairs to enter Entrance Stairs-Rails: None Entrance Stairs-Number of Steps: 1  step Bathroom Shower/Tub: Health visitor: Standard Home Care Services: No  Discharge Living Setting Plans for Discharge Living Setting: Apartment;Other (Comment) (tp stay with [place], girlfriend at d/c) Type of Home at Discharge: Apartment Discharge Home Layout: One level Discharge Home Access: Level entry Discharge Bathroom Shower/Tub: Tub/shower unit Discharge Bathroom Toilet: Standard Discharge Bathroom Accessibility: Yes How Accessible: Accessible via wheelchair;Accessible via walker Does the patient have any problems obtaining your medications?: Yes (Describe) (no medical insurance)  Social/Family/Support Systems Patient Roles: Partner;Parent;Other (Comment) (employee) Contact Information: Marlane Hatcher, girlfriend Anticipated Caregiver: girlfriend Anticipated Caregiver's Contact Information: [phone] Ability/Limitations of Caregiver: no limitations Caregiver Availability: 24/7 Discharge Plan Discussed with Primary Caregiver: Yes Is Caregiver In Agreement with Plan?: Yes Does Caregiver/Family have Issues with Lodging/Transportation while Pt is in Rehab?: No  Goals/Additional Needs Patient/Family Goal for Rehab: supervision with PT and OT Expected length of stay: ELOS 7 to 12 days Pt/Family Agrees to Admission and willing to participate: Yes Program Orientation Provided & Reviewed with Pt/Caregiver Including Roles  & Responsibilities: Yes  Decrease burden of Care through IP rehab admission: n/a  Possible need for SNF placement upon discharge: not anticipated  Patient Condition: This patient's medical and functional status has changed since the consult dated: 03/20/2014 in which the Rehabilitation Physician determined and documented that the  patient's condition is appropriate for intensive rehabilitative care in an inpatient rehabilitation facility. See "History of Present Illness" (above) for medical update. Functional changes are: overall min to mod  assist with transfers. Patient's medical and functional status update has been discussed with the Rehabilitation physician and patient remains appropriate for inpatient rehabilitation. Will admit to inpatient rehab today.  Preadmission Screen Completed By:  Clois Dupes, 03/25/2014 3:17 PM ______________________________________________________________________   Discussed status with Dr. Riley Kill on 03/25/2014 at  1516 and received telephone approval for admission today.  Admission Coordinator:  Clois Dupes, time 1610 Date 03/25/2014.

## 2014-03-25 NOTE — Progress Notes (Signed)
Subjective: 6 Days Post-Op Procedure(s) (LRB): Bilateral Intermedullary RETROGRADE FEMORAL NAILING (Bilateral) Patient reports pain as moderate.  Pt is much better today. Has no new complaints and is eager to go to the rehab facility. His pain is well controlled with pain medications. He has been up with PT, WBAT on LLE and TTWB on RLE. He denies any new HA, CP, SOB, N/V, fever, chills, leg swelling.   Objective: Vital signs in last 24 hours: Temp:  [98.2 F (36.8 C)-98.5 F (36.9 C)] 98.5 F (36.9 C) (09/01 1425) Pulse Rate:  [95-112] 95 (09/01 1425) Resp:  [18-20] 18 (09/01 1425) BP: (113-126)/(68-70) 116/70 mmHg (09/01 1425) SpO2:  [92 %-99 %] 99 % (09/01 1425)  Intake/Output from previous day: 08/31 0701 - 09/01 0700 In: 720 [P.O.:720] Out: -  Intake/Output this shift: Total I/O In: 720 [P.O.:720] Out: -   No results found for this basename: HGB,  in the last 72 hours No results found for this basename: WBC, RBC, HCT, PLT,  in the last 72 hours No results found for this basename: NA, K, CL, CO2, BUN, CREATININE, GLUCOSE, CALCIUM,  in the last 72 hours No results found for this basename: LABPT, INR,  in the last 72 hours  WD/WN african [place] male in nad, sitting upright in bed. A and O x4. Mood and affect are appropriate. EOMI. Respirations normal and unlabored. bilateral lower extremities in soft dressings. Dressings C/D/I. NV intact with brisk capillary refill and motor function of toes. Distal sensation intact bilaterally.   Assessment/Plan: 6 Days Post-Op Procedure(s) (LRB): Bilateral Intermedullary RETROGRADE FEMORAL NAILING (Bilateral) D/C to  [facility]  Sherron Mummert HOWELLS 03/25/2014, 2:49 PM

## 2014-03-25 NOTE — Progress Notes (Signed)
Teacher, early years/pre in to see pt, Lee Perez, phone [phone]. Case under investigation with auto claim. I have left messages with Apolinar Junes and Rada Hay, employer with no call back to date to assess workers compensation possibility. Pt and his girlfriend at bedside. We discussed the lack of clarity of payor for this injury. He is in agreement to admission today as uninsured. I will arrange for today. I will also contact financial counselor. [phone]

## 2014-03-25 NOTE — H&P (Signed)
Physical Medicine and Rehabilitation Admission H&P  Chief Complaint   Patient presents with   .  Trauma    HPI: Lee Perez is a 38 y.o. male who was admitted from work site on 03/19/14 past being struck by a tractor trailer that backed up and pinned him against a concrete wall. Work up with bilateral acute comminuted fracture of the distal femoral diaphysis and patient underwent ORIF bilateral femurs by Dr. Victorino Dike on the same day. Post op TTWB on RLE with KI and WBAT for transfers on LLE. Pain control improving and patient working on transfers and is able to maintain NWB RLE. CIR recommended by MD and rehab team and patient admitted today.  Review of Systems  HENT: Negative for hearing loss.  Eyes: Negative for blurred vision and double vision.  Respiratory: Negative for cough and shortness of breath.  Cardiovascular: Negative for chest pain and palpitations.  Gastrointestinal: Negative for heartburn, nausea, vomiting and constipation.  Genitourinary: Negative for dysuria and urgency.  Musculoskeletal: Positive for back pain and myalgias.  Neurological: Negative for dizziness, sensory change and headaches.  Psychiatric/Behavioral: The patient does not have insomnia.   Past Medical History   Diagnosis  Date   .  Multiple respiratory allergies    .  Wrist fracture, right      as a teenager/casted    Past Surgical History   Procedure  Laterality  Date   .  Mandible fracture surgery     .  Femur im nail  Bilateral  03/19/2014     Procedure: Bilateral Intermedullary RETROGRADE FEMORAL NAILING; Surgeon: Toni Arthurs, MD; Location: [facility] OR; Service: Orthopedics; Laterality: Bilateral;    Family History   Problem  Relation  Age of Onset   .  Diabetes  Mother     Social History: Lives alone but plans on discharging to GF home. GF can provide supervision past discharge. He reports that he has never smoked. He does not have any smokeless tobacco history on file. He reports that he does not  drink alcohol or use illicit drugs.    Allergies: No Known Allergies  No prescriptions prior to admission    Home:  Home Living  Family/patient expects to be discharged to:: Inpatient rehab  Living Arrangements: Parent;Other (Comment) (lived with Mom and 63 yo daughter)  Available Help at Discharge: Family;Available 24 hours/day  Type of Home: Apartment  Home Access: Stairs to enter  Entrance Stairs-Number of Steps: 1 step  Entrance Stairs-Rails: None  Home Layout: One level  Home Equipment: None  Lives With: Family  Functional History:  Prior Function  Level of Independence: Independent  Functional Status:  Mobility:  Bed Mobility  Overal bed mobility: Needs Assistance  Bed Mobility: Supine to Sit  Supine to sit: +2 for physical assistance;Mod assist  General bed mobility comments: Patient sitting EOB upon entering room  Transfers  Overall transfer level: Needs assistance  Equipment used: Rolling walker (2 wheeled)  Transfers: Sit to/from [employer]  Sit to Stand: Min assist  Stand pivot transfers: Min assist  General transfer comment: Patient able to take pivotal hops with transfer this session. Able to maintain R NWB. Very good UE use on RW for hops. Stood with SPT x2 for review of safe technique and for strengthening    ADL:  ADL  Overall ADL's : Needs assistance/impaired  Eating/Feeding: Independent;Sitting  Grooming: Set up;Sitting  Upper Body Bathing: Set up;Sitting  Lower Body Bathing: Total assistance (with +2 sit<>stand)  Upper Body  Dressing : Set up;Sitting  Lower Body Dressing: With adaptive equipment;Minimal assistance (to use AE to get socks off/on and shorts on/off over feet and pull up to thighs--did not do sit<>stand to pull up shorts)  Toilet Transfer: Moderate assistance;+2 for physical assistance;Stand-pivot;RW (bed (raised) to built up recliner)  Toileting- Architect and Hygiene: Total assistance (with total A +2  sit<stand)  General ADL Comments: Pt c/o pain just above right heel, removed ace wrap to look at it and no outward sore noted (pt reports it is tender right at achilles tendon attachment point), re-wrapped leg with ace wrap and propped up Bil LEs so ankles "floating"  Cognition:  Cognition  Overall Cognitive Status: Within Functional Limits for tasks assessed  Orientation Level: Oriented X4  Cognition  Arousal/Alertness: Awake/alert  Behavior During Therapy: WFL for tasks assessed/performed  Overall Cognitive Status: Within Functional Limits for tasks assessed  Physical Exam:  Blood pressure 116/70, pulse 95, temperature 98.5 F (36.9 C), temperature source Oral, resp. rate 18, height  (1.88 m), weight 108.863 kg (240 lb), SpO2 99.00%.  Physical Exam  Nursing note and vitals reviewed.  Constitutional: He is oriented to person, place, and time. He appears well-developed and well-nourished.  HENT:  Head: Normocephalic and atraumatic.  Eyes: Conjunctivae are normal. Pupils are equal, round, and reactive to light.  Neck: Normal range of motion. Neck supple.  Cardiovascular: Regular rhythm. Tachycardia present.  Respiratory: Effort normal and breath sounds normal. No respiratory distress. He has no wheezes. He has no rhonchi.  GI: Soft. Bowel sounds are normal. He exhibits no distension. There is no tenderness.  Musculoskeletal:  BLE ace wrapped and KI in place. Mobility limited due to [facility] and lower back spasms reported with attempts at repositioning BLE.  Neurological: He is alert and oriented to person, place, and time.  Moves all 4's. LE's limited by splints/pain.  Skin: Skin is warm and dry. All incision sites are intact with minimal drainage, wounds well approximated with staples Psychiatric: He has a normal mood and affect. His behavior is normal. Judgment and thought content normal     Medical Problem List and Plan:  1. Functional deficits secondary to bilateral distal femur  fractures--  2. DVT Prophylaxis/Anticoagulation: Pharmaceutical: Lovenox  3. Pain Management: continue Celebrex daily. Oxycodone prn for pain. D/c dilaudid.  4. Mood: LCSW to follow for evaluation and support.  5. Neuropsych: This patient is capable of making decisions on her own behalf.  6. Skin/Wound Care: Routine pressure relief measures. Monitor wound daily for healing.  7. Hypokalemia: Will recheck in am and supplement as indicated.  8. Constipation: Did have some results yesterday. Will increase Senna to tid.    Post Admission Physician Evaluation:  1. Functional deficits secondary to bilateral distal femur fractures.  2. Patient is admitted to receive collaborative, interdisciplinary care between the physiatrist, rehab nursing staff, and therapy team. 3. Patient's level of medical complexity and substantial therapy needs in context of that medical necessity cannot be provided at a lesser intensity of care such as a SNF. 4. Patient has experienced substantial functional loss from his/her baseline which was documented above under the "Functional History" and "Functional Status" headings. Judging by the patient's diagnosis, physical exam, and functional history, the patient has potential for functional progress which will result in measurable gains while on inpatient rehab. These gains will be of substantial and practical use upon discharge in facilitating mobility and self-care at the household level. 5. Physiatrist will provide 24 hour management  of medical needs as well as oversight of the therapy plan/treatment and provide guidance as appropriate regarding the interaction of the two. 6. 24 hour rehab nursing will assist with bladder management, bowel management, safety, skin/wound care, disease management, medication administration, pain management and patient education and help integrate therapy concepts, techniques,education, etc. 7. PT will assess and treat for/with: Lower extremity  strength, range of motion, stamina, balance, functional mobility, safety, adaptive techniques and equipment, pain mgt, ortho precautions. Goals are: mod I to supervision. 8. OT will assess and treat for/with: ADL's, functional mobility, safety, upper extremity strength, adaptive techniques and equipment, ortho precautions. Goals are: mod I to supervision. Therapy may proceed with showering this patient. 9. SLP will assess and treat for/with: n/a. Goals are: n/a. 10. Case Management and Social Worker will assess and treat for psychological issues and discharge planning. 11. Team conference will be held weekly to assess progress toward goals and to determine barriers to discharge. 12. Patient will receive at least 3 hours of therapy per day at least 5 days per week. 13. ELOS: 7-8 days  14. Prognosis: excellent  Ranelle Oyster, MD, [facility] Physical Medicine & Rehabilitation   03/25/2014

## 2014-03-25 NOTE — Progress Notes (Signed)
Pt being transferred to inpt rehab at this time.Report called to ED RN. ED verbalized understanding of the report given and felt comfortable with the transfer and Pts plan of care. Pt will be going to 4W01 by wheelchair with the assistance of Manny NT. Pts girlfriend is a the bedside and is aware of the transfer to inpt rehab a this time.

## 2014-03-26 ENCOUNTER — Inpatient Hospital Stay (HOSPITAL_COMMUNITY): Payer: Self-pay | Admitting: Occupational Therapy

## 2014-03-26 ENCOUNTER — Inpatient Hospital Stay (HOSPITAL_COMMUNITY): Payer: Self-pay | Admitting: Rehabilitation

## 2014-03-26 DIAGNOSIS — S72009D Fracture of unspecified part of neck of unspecified femur, subsequent encounter for closed fracture with routine healing: Secondary | ICD-10-CM

## 2014-03-26 DIAGNOSIS — S72009A Fracture of unspecified part of neck of unspecified femur, initial encounter for closed fracture: Secondary | ICD-10-CM

## 2014-03-26 LAB — COMPREHENSIVE METABOLIC PANEL
ALT: 51 U/L (ref 0–53)
AST: 46 U/L — AB (ref 0–37)
Albumin: 2.8 g/dL — ABNORMAL LOW (ref 3.5–5.2)
Alkaline Phosphatase: 60 U/L (ref 39–117)
Anion gap: 11 (ref 5–15)
BUN: 14 mg/dL (ref 6–23)
CALCIUM: 8.5 mg/dL (ref 8.4–10.5)
CO2: 30 mEq/L (ref 19–32)
CREATININE: 1.05 mg/dL (ref 0.50–1.35)
Chloride: 100 mEq/L (ref 96–112)
GFR, EST NON AFRICAN AMERICAN: 88 mL/min — AB (ref >90)
GLUCOSE: 98 mg/dL (ref 70–99)
Potassium: 3.8 mEq/L (ref 3.7–5.3)
Sodium: 141 mEq/L (ref 137–147)
Total Bilirubin: 1 mg/dL (ref 0.3–1.2)
Total Protein: 6.3 g/dL (ref 6.0–8.3)

## 2014-03-26 LAB — CBC WITH DIFFERENTIAL/PLATELET
BASOS ABS: 0 10*3/uL (ref 0.0–0.1)
Basophils Relative: 0 % (ref 0–1)
EOS PCT: 6 % — AB (ref 0–5)
Eosinophils Absolute: 0.4 10*3/uL (ref 0.0–0.7)
HEMATOCRIT: 23.7 % — AB (ref 39.0–52.0)
Hemoglobin: 8 g/dL — ABNORMAL LOW (ref 13.0–17.0)
LYMPHS PCT: 26 % (ref 12–46)
Lymphs Abs: 1.7 10*3/uL (ref 0.7–4.0)
MCH: 30.1 pg (ref 26.0–34.0)
MCHC: 33.8 g/dL (ref 30.0–36.0)
MCV: 89.1 fL (ref 78.0–100.0)
MONO ABS: 0.7 10*3/uL (ref 0.1–1.0)
Monocytes Relative: 10 % (ref 3–12)
Neutro Abs: 3.9 10*3/uL (ref 1.7–7.7)
Neutrophils Relative %: 58 % (ref 43–77)
Platelets: 309 10*3/uL (ref 150–400)
RBC: 2.66 MIL/uL — ABNORMAL LOW (ref 4.22–5.81)
RDW: 12.9 % (ref 11.5–15.5)
WBC: 6.7 10*3/uL (ref 4.0–10.5)

## 2014-03-26 MED ORDER — MORPHINE SULFATE ER 30 MG PO TBCR
30.0000 mg | EXTENDED_RELEASE_TABLET | Freq: Two times a day (BID) | ORAL | Status: DC
  Administered 2014-03-26 – 2014-03-29 (×7): 30 mg via ORAL
  Filled 2014-03-26 (×7): qty 1

## 2014-03-26 MED ORDER — MORPHINE SULFATE 15 MG PO TABS
15.0000 mg | ORAL_TABLET | ORAL | Status: DC | PRN
  Administered 2014-03-26 – 2014-03-29 (×13): 15 mg via ORAL
  Filled 2014-03-26 (×13): qty 1

## 2014-03-26 MED ORDER — SENNOSIDES-DOCUSATE SODIUM 8.6-50 MG PO TABS: 2.0000 | ORAL_TABLET | Freq: Two times a day (BID) | ORAL | Status: DC

## 2014-03-26 MED ORDER — OXYCODONE HCL ER 10 MG PO T12A: 10.0000 mg | EXTENDED_RELEASE_TABLET | Freq: Every day | ORAL | Status: DC

## 2014-03-26 MED ORDER — SENNOSIDES-DOCUSATE SODIUM 8.6-50 MG PO TABS
3.0000 | ORAL_TABLET | Freq: Two times a day (BID) | ORAL | Status: DC
  Administered 2014-03-26 – 2014-03-29 (×6): 3 via ORAL
  Filled 2014-03-26 (×6): qty 3

## 2014-03-26 NOTE — Patient Care Conference (Signed)
Inpatient RehabilitationTeam Conference and Plan of Care Update Date: 03/26/2014   Time: 12;00 pm    Patient Name: Stoy Fenn      Medical Record Number: [id number]  Date of Birth: [birth year] Sex: Male         Room/Bed: 4W01C/4W01C-01 Payor Info: Payor: /    Admitting Diagnosis: B DISTAL FEMUR FXS  Admit Date/Time:  03/25/2014  6:17 PM Admission Comments: No comment available   Primary Diagnosis:  Femur fracture Principal Problem: Femur fracture  Patient Active Problem List   Diagnosis Date Noted  . Acute blood loss anemia 03/28/2014  . Pain in joint, lower leg 03/28/2014  . Unspecified constipation 03/28/2014  . Femur fracture 03/25/2014  . Closed fracture of femur, distal end 03/19/2014    Expected Discharge Date: Expected Discharge Date: 03/29/14  Team Members Present: Physician leading conference: Dr. Claudette Laws Social Worker Present: Dossie Der, LCSW Nurse Present: Carmie End, RN PT Present: Edman Circle, PT;Emily Parcell, PT;Endy Easterly Evelina Bucy, PT OT Present: Callie Fielding, Heath Lark, OT SLP Present: Jackalyn Lombard, SLP PPS Coordinator present : Tora Duck, RN, CRRN     Current Status/Progress Goal Weekly Team Focus  Medical     pain management-limited by [facility] issues   pain under control-med adjustment     Bowel/Bladder     Cont B & B        Swallow/Nutrition/ Hydration     [facility]        ADL's   set up A for UB dressing, Min A for bathing and grooming(standing) and toilet transfers, Mod A for LB dressing  overall Mod I  functional transfers, self care, patient and family education   Mobility    S overall  Mod I overall  transfers, stair training, w/c set up and management, family/pt education.   Communication     [facility]        Safety/Cognition/ Behavioral Observations    no unsafe behaviors        Pain             Skin                *See Care Plan and progress notes for long and short-term goals.  Barriers to Discharge:   Pain management-WB  status   Possible Resolutions to Barriers:    pain adjusting meds   Discharge Planning/Teaching Needs:    Going to girlfriend's apartment when discharged.  Will need DME and follow up.  Unsure if has any insurance coverage.     Team Discussion:  New eval pain issues, try to premedicate before therapies.  Now has long acting pain med at nighttime. Assess DME needs.  Revisions to Treatment Plan:  New eval      Lucy Chris 03/28/2014, 3:47 PM

## 2014-03-26 NOTE — Care Management Note (Signed)
Inpatient Rehabilitation Center Individual Statement of Services  Patient Name:  Lee Perez  Date:  03/26/2014  Welcome to the Inpatient Rehabilitation Center.  Our goal is to provide you with an individualized program based on your diagnosis and situation, designed to meet your specific needs.  With this comprehensive rehabilitation program, you will be expected to participate in at least 3 hours of rehabilitation therapies Monday-Friday, with modified therapy programming on the weekends.  Your rehabilitation program will include the following services:  Physical Therapy (PT), Occupational Therapy (OT), 24 hour per day rehabilitation nursing, Case Management (Social Worker), Rehabilitation Medicine, Nutrition Services and Pharmacy Services  Weekly team conferences will be held on Wednesday to discuss your progress.  Your Social Worker will talk with you frequently to get your input and to update you on team discussions.  Team conferences with you and your family in attendance may also be held.  Expected length of stay: 3-5 days  Overall anticipated outcome: mod/i transfers only-w/c level  Depending on your progress and recovery, your program may change. Your Social Worker will coordinate services and will keep you informed of any changes. Your Social Worker's name and contact numbers are listed  below.  The following services may also be recommended but are not provided by the Inpatient Rehabilitation Center:   Driving Evaluations  Home Health Rehabiltiation Services  Outpatient Rehabilitation Services  Vocational Rehabilitation   Arrangements will be made to provide these services after discharge if needed.  Arrangements include referral to agencies that provide these services.  Your insurance has been verified to be:  Self Pay Your primary doctor is:  None  Pertinent information will be shared with your doctor and your insurance company.  Social Worker:  Dossie Der, SW  [phone] or [phone]  Information discussed with and copy given to patient by: Lucy Chris, 03/26/2014, 3:22 PM

## 2014-03-26 NOTE — Progress Notes (Signed)
Recreational Therapy Session Note  Patient Details  Name: Vann Okerlund MRN: [id number] Date of Birth: 25-Jul-1976 Today's Date: 03/26/2014  Informed by PT that pt with anticipated LOS of 3 days, therefore formal eval deferred.  Will continue to monitor through team. Tashawna Thom 03/26/2014, 4:41 PM

## 2014-03-26 NOTE — Progress Notes (Signed)
Patient information reviewed and entered into [facility] system by Nylee Barbuto, RN, CRRN, PPS Coordinator.  Information including medical coding and functional independence measure will be reviewed and updated through discharge.    

## 2014-03-26 NOTE — Progress Notes (Addendum)
38 y.o. male who was admitted from work site on 03/19/14 past being struck by a tractor trailer that backed up and pinned him against a concrete wall. Work up with bilateral acute comminuted fracture of the distal femoral diaphysis and patient underwent ORIF bilateral femurs by Dr. Doran Durand on the same day. Post op TTWB on RLE with KI and WBAT for transfers on LLE.  Subjective/Complaints: Pt without new issues, uncomfortable overnite with pain but feels better this am with movement /therapy  Bowels are now moving, freq stools, no bladder issues  Objective: Vital Signs: Blood pressure 121/56, pulse 87, temperature 99 F (37.2 C), temperature source Oral, resp. rate 18, SpO2 94.00%. No results found. Results for orders placed during the hospital encounter of 03/25/14 (from the past 72 hour(s))  CBC WITH DIFFERENTIAL     Status: Abnormal   Collection Time    03/26/14  5:00 AM      Result Value Ref Range   WBC 6.7  4.0 - 10.5 K/uL   RBC 2.66 (*) 4.22 - 5.81 MIL/uL   Hemoglobin 8.0 (*) 13.0 - 17.0 g/dL   HCT 23.7 (*) 39.0 - 52.0 %   MCV 89.1  78.0 - 100.0 fL   MCH 30.1  26.0 - 34.0 pg   MCHC 33.8  30.0 - 36.0 g/dL   RDW 12.9  11.5 - 15.5 %   Platelets 309  150 - 400 K/uL   Neutrophils Relative % 58  43 - 77 %   Neutro Abs 3.9  1.7 - 7.7 K/uL   Lymphocytes Relative 26  12 - 46 %   Lymphs Abs 1.7  0.7 - 4.0 K/uL   Monocytes Relative 10  3 - 12 %   Monocytes Absolute 0.7  0.1 - 1.0 K/uL   Eosinophils Relative 6 (*) 0 - 5 %   Eosinophils Absolute 0.4  0.0 - 0.7 K/uL   Basophils Relative 0  0 - 1 %   Basophils Absolute 0.0  0.0 - 0.1 K/uL  COMPREHENSIVE METABOLIC PANEL     Status: Abnormal   Collection Time    03/26/14  5:00 AM      Result Value Ref Range   Sodium 141  137 - 147 mEq/L   Potassium 3.8  3.7 - 5.3 mEq/L   Chloride 100  96 - 112 mEq/L   CO2 30  19 - 32 mEq/L   Glucose, Bld 98  70 - 99 mg/dL   BUN 14  6 - 23 mg/dL   Creatinine, Ser 1.05  0.50 - 1.35 mg/dL   Calcium 8.5   8.4 - 10.5 mg/dL   Total Protein 6.3  6.0 - 8.3 g/dL   Albumin 2.8 (*) 3.5 - 5.2 g/dL   AST 46 (*) 0 - 37 U/L   ALT 51  0 - 53 U/L   Alkaline Phosphatase 60  39 - 117 U/L   Total Bilirubin 1.0  0.3 - 1.2 mg/dL   GFR calc non Af Amer 88 (*) >90 mL/min   GFR calc Af Amer >90  >90 mL/min   Comment: (NOTE)     The eGFR has been calculated using the CKD EPI equation.     This calculation has not been validated in all clinical situations.     eGFR's persistently <90 mL/min signify possible Chronic Kidney     Disease.   Anion gap 11  5 - 15     HEENT: normal Cardio: RRR and no murmur Resp: CTA  B/L and unlabored GI: BS positive and NT, ND Extremity:  Edema BLE look ok with ACE wraps Skin:   Other wounds covered with Bledsoe and ace wraps Neuro: Alert/Oriented, Normal Sensory and Abnormal Motor 5/5 in BUE, 4/5 bilateral ADF, APF, trace bilateral HF Musc/Skel:  Swelling bilateral knees Gen NAD   Assessment/Plan: 1. Functional deficits secondary to BIlateral distal femur fx which require 3+ hours per day of interdisciplinary therapy in a comprehensive inpatient rehab setting. Physiatrist is providing close team supervision and 24 hour management of active medical problems listed below. Physiatrist and rehab team continue to assess barriers to discharge/monitor patient progress toward functional and medical goals. FIM:                   Comprehension Comprehension Mode: Auditory Comprehension: 7-Follows complex conversation/direction: With no assist  Expression Expression Mode: Verbal Expression: 7-Expresses complex ideas: With no assist  Social Interaction Social Interaction: 7-Interacts appropriately with others - No medications needed.  Problem Solving Problem Solving: 7-Solves complex problems: Recognizes & self-corrects  Memory Memory: 7-Complete Independence: No helper  Medical Problem List and Plan:   1. Functional deficits secondary to bilateral distal femur  fractures-- TTWB Right, WBAT for transfers only on left  2. DVT Prophylaxis/Anticoagulation: Pharmaceutical: Lovenox   3. Pain Management: continue Celebrex daily. Oxycodone prn for pain. Add low dose oxycontin at hs  4. Mood: LCSW to follow for evaluation and support.   5. Neuropsych: This patient is capable of making decisions on her own behalf.   6. Skin/Wound Care: Routine pressure relief measures. Monitor wound daily for healing.   7. Hypokalemia: Will recheck in am and supplement as indicated.   8. Constipation: no recorded BM    LOS (Days) 1 A FACE TO FACE EVALUATION WAS PERFORMED  Lee Perez 03/26/2014, 9:18 AM

## 2014-03-26 NOTE — Plan of Care (Signed)
Problem: RH SKIN INTEGRITY Goal: RH STG ABLE TO PERFORM INCISION/WOUND CARE W/ASSISTANCE STG Able To Perform Incision/Wound Care With Assistance.  Outcome: Not Applicable Date Met:  03/26/14 Pt has ace wrap to both lower legs and dressings to be changed by Surgeon tomm. Per shift report.wbb     

## 2014-03-26 NOTE — Progress Notes (Signed)
PMR Admission Coordinator Pre-Admission Assessment   Patient: Lee Perez is an 38 y.o., male MRN: [id number] DOB: [birth year] Height:  (188 cm) Weight: 108.863 kg (240 lb)                                                                                                                                                  Insurance Information HMO:     PPO:      PCP:      IPA:      80/20:      OTHER:   PRIMARY: uninsured Pt aware he is admitting as uninsured     Policy#:       Subscriber:   Lee Perez [id number] and Lee Perez [phone], employer [employer]. 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 Lee Perez contacted pt and stated investigating the claim. [facility] Specialist [place], [phone]. I spoke with her 9/1 after her visit with pt. She states claim under investigation and she will not know for about one week.   Medicaid Application Date:       Case Manager:  Disability Application Date:       Case Worker:    Emergency Contact Information Contact Information     Name  Relation  Home  Work  Mobile     Lee Perez,Lee Perez  Significant other      [phone]     Lee Perez, Lee Perez  Mother  [phone]           Current Medical History  Patient Admitting Diagnosis:bilateral distal femur fx's    History of Present Illness: HPI: Lee Perez is a 38 y.o. male who was admitted from work site on 03/19/14 past being struck by a tractor trailer that backed up and pinned him against a concrete wall.    Work up with bilateral acute comminuted fracture of the distal femoral diaphysis and patient underwent ORIF bilateral femurs by Dr. Victorino Dike on the same day. Post op TTWB on RLE and WBAT for transfers on LLE.    Past Medical History  Past Medical History   Diagnosis  Date   .  Multiple respiratory allergies     .  Wrist fracture, right         as a teenager/casted      Family History  family history includes Diabetes in his  mother.   Prior Rehab/Hospitalizations: none   Current Medications  Current facility-administered medications:celecoxib (CELEBREX) capsule 200 mg, 200 mg, Oral, Daily, Toni Arthurs, MD, 200 mg at 03/25/14 1244;  docusate sodium (COLACE) capsule 100 mg, 100 mg, Oral, BID, Remo Lipps, [place], 100 mg at 03/25/14 1245;  enoxaparin (LOVENOX) injection 40 mg, 40 mg, Subcutaneous, Q24H, Jacquelyn Keystone, [place], 40 mg at 03/25/14 0803 HYDROmorphone (DILAUDID) injection 0.5-1 mg, 0.5-1 mg, Intravenous, Q1H PRN, Lesli Albee  Freida Busman, PA, 1 mg at 03/24/14 1824;  ondansetron (ZOFRAN) injection 4 mg, 4 mg, Intravenous, Q6H PRN, Remo Lipps, PA;  ondansetron [facility]) tablet 4 mg, 4 mg, Oral, Q6H PRN, Remo Lipps, [place];  oxyCODONE (Oxy IR/ROXICODONE) immediate release tablet 5-10 mg, 5-10 mg, Oral, Q3H PRN, Remo Lipps, PA, 10 mg at 03/25/14 1245 senna (SENOKOT) tablet 17.2 mg, 2 tablet, Oral, BID, Toni Arthurs, MD, 17.2 mg at 03/25/14 1245   Patients Current Diet: General   Precautions / Restrictions Precautions Precautions: Fall Precaution Comments: Knee immobilizer on Rt (per Dr. 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 Was living at apartment with his 73 yo. This child is with his mother.   Home Assistive Devices / Equipment Home Assistive Devices/Equipment: None Home Equipment: None   Prior Functional Level Prior Function Level of Independence: Independent   Current Functional Level Cognition   Overall Cognitive Status: Within Functional Limits for tasks assessed Orientation Level: Oriented X4     Extremity  Assessment (includes Sensation/Coordination)          ADLs   Overall ADL's : Needs assistance/impaired Eating/Feeding: Independent;Sitting Grooming: Set up;Sitting Upper Body Bathing: Set up;Sitting Lower Body Bathing: Total assistance (with +2 sit<>stand) Upper Body Dressing : Set up;Sitting Lower Body Dressing: With adaptive equipment;Minimal assistance (to use AE to get socks off/on and shorts on/off over feet and pull up to thighs--did not do sit<>stand to pull up shorts) Toilet Transfer: Moderate assistance;+2 for physical assistance;Stand-pivot;RW (bed (raised) to built up recliner) Toileting- Architect and Hygiene: Total assistance (with total A +2 sit<stand) General ADL Comments: Pt c/o pain just above right heel, removed ace wrap to look at it and no outward sore noted (pt reports it is tender right at achilles tendon attachment point), re-wrapped leg with ace wrap and propped up Bil LEs so ankles "floating"      Mobility   Overal bed mobility: Needs Assistance Bed Mobility: Supine to Sit Supine to sit: +2 for physical assistance;Mod assist General bed mobility comments: Patient sitting EOB upon entering room      Transfers   Overall transfer level: Needs assistance Equipment used: Rolling walker (2 wheeled) Transfers: Sit to/from [employer] Sit to Stand: Min assist Stand pivot transfers: Min assist General transfer comment: Patient able to take pivotal hops with transfer this session. Able to maintain R NWB. Very good UE use on RW for hops. Stood with SPT x2 for review of safe technique and for strengthening      Ambulation / Gait / Stairs / Environmental manager / Balance       Special needs/care consideration  Bowel mgmt: BM 8/31 after Mag citrate, senokot and colace Bladder mgmt: continent       Previous Home Environment Living Arrangements: Parent;Other (Comment) (lived with Mom and 23 yo daughter)  Lives With:  Family Available Help at Discharge: Family;Available 24 hours/day Type of Home: Apartment Home Layout: One level Home Access: Stairs to enter Entrance Stairs-Rails: None Entrance Stairs-Number of Steps: 1 step Bathroom Shower/Tub: Health visitor: Standard Home Care Services: No   Discharge Living Setting Plans for Discharge Living Setting: Apartment;Other (Comment) (tp stay  with Lee Perez, girlfriend at d/c) Type of Home at Discharge: Apartment Discharge Home Layout: One level Discharge Home Access: Level entry Discharge Bathroom Shower/Tub: Tub/shower unit Discharge Bathroom Toilet: Standard Discharge Bathroom Accessibility: Yes How Accessible: Accessible via wheelchair;Accessible via walker Does the patient have any problems obtaining your medications?: Yes (Describe) (no medical insurance)   Social/Family/Support Systems Patient Roles: Partner;Parent;Other (Comment) (employee) Contact Information: Lee Perez, girlfriend Anticipated Caregiver: girlfriend Anticipated Caregiver's Contact Information: [phone] Ability/Limitations of Caregiver: no limitations Caregiver Availability: 24/7 Discharge Plan Discussed with Primary Caregiver: Yes Is Caregiver In Agreement with Plan?: Yes Does Caregiver/Family have Issues with Lodging/Transportation while Pt is in Rehab?: No   Goals/Additional Needs Patient/Family Goal for Rehab: supervision with PT and OT Expected length of stay: ELOS 7 to 12 days Pt/Family Agrees to Admission and willing to participate: Yes Program Orientation Provided & Reviewed with Pt/Caregiver Including Roles  & Responsibilities: Yes   Decrease burden of Care through IP rehab admission: n/a   Possible need for SNF placement upon discharge: not anticipated   Patient Condition: This patient's medical and functional status has changed since the consult dated: 03/20/2014 in which the Rehabilitation Physician determined and documented that  the patient's condition is appropriate for intensive rehabilitative care in an inpatient rehabilitation facility. See "History of Present Illness" (above) for medical update. Functional changes are: overall min to mod assist with transfers. Patient's medical and functional status update has been discussed with the Rehabilitation physician and patient remains appropriate for inpatient rehabilitation. Will admit to inpatient rehab today.   Preadmission Screen Completed By:  Clois Dupes, 03/25/2014 3:17 PM ______________________________________________________________________    Discussed status with Dr. Riley Kill on 03/25/2014 at  1516 and received telephone approval for admission today.   Admission Coordinator:  Clois Dupes, time 9147 Date 03/25/2014.          Cosigned by: Ranelle Oyster, MD    [03/25/2014 4:12 PM]  Revision History...     Date/Time User Action   03/25/2014 4:12 PM Ranelle Oyster, MD Cosign   03/25/2014 4:08 PM Clois Dupes, RN Sign   03/25/2014 3:17 PM Clois Dupes, RN Sign  View Details Report
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
## 2014-03-26 NOTE — Evaluation (Signed)
Physical Therapy Assessment and Plan  Patient Details  Name: Lee Perez MRN: [id number] Date of Birth: Mar 25, 1976  PT Diagnosis: Muscle weakness in BLEs from injury Rehab Potential: Excellent ELOS: 3 days   Today's Date: 03/26/2014 PT Individual Time: 1779-3903 PT Individual Time Calculation (min): 61 min    Problem List:  Patient Active Problem List   Diagnosis Date Noted  . Femur fracture 03/25/2014  . Closed fracture of femur, distal end 03/19/2014    Past Medical History:  Past Medical History  Diagnosis Date  . Multiple respiratory allergies   . Wrist fracture, right     as a teenager/casted   Past Surgical History:  Past Surgical History  Procedure Laterality Date  . Mandible fracture surgery    . Femur im nail Bilateral 03/19/2014    Procedure: Bilateral Intermedullary RETROGRADE FEMORAL NAILING;  Surgeon: Wylene Simmer, MD;  Location: [place];  Service: Orthopedics;  Laterality: Bilateral;    Assessment & Plan Clinical Impression: Patient is a 38 y.o. year old male who was admitted from work site on 03/19/14 past being struck by a tractor trailer that backed up and pinned him against a concrete wall. Work up with bilateral acute comminuted fracture of the distal femoral diaphysis and patient underwent ORIF bilateral femurs by Dr. Doran Durand on the same day. Post op TTWB on RLE with KI and WBAT for transfers on LLE.  Patient transferred to CIR on 03/25/2014 .   Patient currently requires min with mobility secondary to muscle weakness.  Prior to hospitalization, patient was independent  with mobility and lived with Significant other in a Apartment (going to stay with girlfriend at her apartment) home.  Home access is 3Stairs to enter.  Patient will benefit from skilled PT intervention to maximize safe functional mobility, minimize fall risk and decrease caregiver burden for planned discharge home with intermittent supervision.  Anticipate patient will no follow up until able to WB  through LEs at discharge.  PT - End of Session Activity Tolerance: Tolerates 10 - 20 min activity with multiple rests PT Assessment Rehab Potential: Excellent Barriers to Discharge: Inaccessible home environment PT Patient demonstrates impairments in the following area(s): Balance;Motor;Pain;Safety PT Transfers Functional Problem(s): Bed Mobility;Bed to Chair;Car;Furniture PT Locomotion Functional Problem(s): Ambulation;Wheelchair Mobility PT Plan PT Intensity: Minimum of 1-2 x/day ,45 to 90 minutes PT Frequency: 5 out of 7 days PT Duration Estimated Length of Stay: 3 days PT Treatment/Interventions: Discharge planning;DME/adaptive equipment instruction;Functional mobility training;Pain management;Patient/family education;Therapeutic Activities;Therapeutic Exercise;UE/LE Strength taining/ROM;Wheelchair propulsion/positioning PT Transfers Anticipated Outcome(s): Mod I  PT Locomotion Anticipated Outcome(s): none set due to MD orders PT Recommendation Follow Up Recommendations: None (none until able to WB on LEs) Patient destination: Home Equipment Recommended: 3 in 1 bedside comode;Wheelchair cushion (measurements);Wheelchair (measurements);Rolling walker with 5" wheels Equipment Details: bari RW if possible  Skilled Therapeutic Intervention PT assessment and evaluation completed, see full details below.  Initiated education on management of w/c parts to increase independence.  Pt tolerated very well and performed all transfers at min/guard level.  Began education on DME for home.  Also provided education on getting measurements of doorways at home, where to place w/c at home at night, set up of bedside commode if he can't get into restroom.  Pt and girlfriend verbalized understanding.  Also discussed LOS and expected outcomes, etc.  Pt left in w/c with all needs in reach.   PT Evaluation Precautions/Restrictions Precautions Precautions: Fall Precaution Comments: Knee immobilizer on RLE  when OOB Required Braces or Orthoses:  Knee Immobilizer - Right Knee Immobilizer - Right: On when out of bed or walking Restrictions Weight Bearing Restrictions: Yes RLE Weight Bearing: Touchdown weight bearing LLE Weight Bearing: Weight bearing as tolerated Other Position/Activity Restrictions: transfers only General Chart Reviewed: Yes Family/Caregiver Present: Yes (GF) Vital Signs  Pain Pain Assessment Pain Assessment: 0-10 Pain Score: 7  Pain Type: Surgical pain Pain Location: Leg Pain Orientation: Right;Left Pain Descriptors / Indicators: Aching Pain Intervention(s): RN made aware;Rest;Repositioned Home Living/Prior Functioning Home Living Available Help at Discharge: Family;Available 24 hours/day Type of Home: Apartment (going to stay with girlfriend at her apartment) Home Access: Stairs to enter [employer] of Steps: 3 Entrance Stairs-Rails: Right Home Layout: One level  Lives With: Significant other Prior Function Level of Independence: Independent with basic ADLs;Independent with gait;Independent with transfers;Independent with homemaking with ambulation  Able to Take Stairs?: Yes Driving: Yes Vocation: Full time employment Leisure: Hobbies-yes (Comment) Comments: bowling Vision/Perception   No visual deficits.  Cognition Overall Cognitive Status: Within Functional Limits for tasks assessed Arousal/Alertness: Awake/alert Orientation Level: Oriented X4 Attention: Alternating Alternating Attention: Appears intact Memory: Appears intact Awareness: Appears intact Problem Solving: Appears intact Safety/Judgment: Appears intact Comments: Pt very aware of deficits and aware that he needs assist when getting up Sensation Sensation Light Touch: Appears Intact Stereognosis: Not tested Hot/Cold: Appears Intact Proprioception: Appears Intact Coordination Gross Motor Movements are Fluid and Coordinated: Yes Fine Motor Movements are Fluid and Coordinated:  Yes Finger Nose Finger Test: WFLs Heel Shin Test: unable to formally test due to pain and ROM limitations  Motor  Motor Motor: Other (comment) Motor - Skilled Clinical Observations: Pt presents with decreased strength in LEs and pain during mobility.  Mobility Bed Mobility Bed Mobility: Supine to Sit Supine to Sit: 5: Supervision Supine to Sit Details: Verbal cues for precautions/safety Supine to Sit Details (indicate cue type and reason): Pt able to get to EOB with HOB flat and without rails Transfers Transfers: Yes Sit to Stand: 4: Min guard;4: Min assist Sit to Stand Details: Verbal cues for sequencing;Verbal cues for technique;Verbal cues for precautions/safety Stand to Sit: 4: Min guard;4: Min assist Stand to Sit Details (indicate cue type and reason): Verbal cues for sequencing;Verbal cues for technique;Verbal cues for precautions/safety;Verbal cues for safe use of DME/AE Stand Pivot Transfers: 4: Min assist;4: Min guard Stand Pivot Transfer Details: Verbal cues for sequencing;Verbal cues for technique;Verbal cues for precautions/safety Locomotion  Ambulation Ambulation: No (per MD, do transfers only) Gait Gait: No Stairs / Additional Locomotion Stairs: No Wheelchair Mobility Wheelchair Mobility: Yes Wheelchair Assistance: 5: Investment banker, operational Details: Verbal cues for sequencing;Verbal cues for technique;Verbal cues for Information systems manager: Both upper extremities Wheelchair Parts Management: Supervision/cueing Distance: 150  Trunk/Postural Assessment  Cervical Assessment Cervical Assessment: Within Functional Limits Thoracic Assessment Thoracic Assessment: Within Functional Limits Lumbar Assessment Lumbar Assessment: Within Functional Limits Postural Control Postural Control: Within Functional Limits  Balance Balance Balance Assessed: Yes Static Sitting Balance Static Sitting - Balance Support: Feet supported;No upper  extremity supported Static Sitting - Level of Assistance: 6: Modified independent (Device/Increase time) Dynamic Sitting Balance Dynamic Sitting - Balance Support: Feet supported;No upper extremity supported Dynamic Sitting - Level of Assistance: 5: Stand by assistance Static Standing Balance Static Standing - Balance Support: During functional activity;Bilateral upper extremity supported Static Standing - Level of Assistance: 5: Stand by assistance Extremity Assessment  RUE Assessment RUE Assessment: Within Functional Limits LUE Assessment LUE Assessment: Within Functional Limits RLE Assessment RLE Assessment: Exceptions to [facility] RLE  Strength RLE Overall Strength: Deficits;Due to precautions RLE Overall Strength Comments: Unable to formally test due to WB precautions.  LLE Assessment LLE Assessment: Exceptions to [facility] LLE Strength LLE Overall Strength: Deficits;Due to pain LLE Overall Strength Comments: unable to formally asses due to pain, pt able to move LEs in/out of bed on his own when getting out of bed.   FIM:  FIM - Control and instrumentation engineer Devices: Copy: 5: Supine > Sit: Supervision (verbal cues/safety issues);4: Bed > Chair or W/C: Min A (steadying Pt. > 75%);4: Chair or W/C > Bed: Min A (steadying Pt. > 75%) FIM - Locomotion: Wheelchair Distance: 150 Locomotion: Wheelchair: 5: Travels 150 ft or more: maneuvers on rugs and over door sills with supervision, cueing or coaxing FIM - Locomotion: Ambulation Locomotion: Ambulation Assistive Devices:  (per MD, transfers only) Locomotion: Ambulation: 0: Activity did not occur FIM - Locomotion: Stairs Locomotion: Stairs: 0: Activity did not occur (per MD, transfers only)   Refer to Care Plan for Long Term Goals  Recommendations for other services: None  Discharge Criteria: Patient will be discharged from PT if patient refuses treatment 3 consecutive times without medical reason, if  treatment goals not met, if there is a change in medical status, if patient makes no progress towards goals or if patient is discharged from hospital.  The above assessment, treatment plan, treatment alternatives and goals were discussed and mutually agreed upon: by patient  Denice Bors 03/26/2014, 11:49 AM

## 2014-03-27 ENCOUNTER — Encounter (HOSPITAL_COMMUNITY): Payer: Self-pay | Admitting: Occupational Therapy

## 2014-03-27 ENCOUNTER — Inpatient Hospital Stay (HOSPITAL_COMMUNITY): Payer: Self-pay | Admitting: Rehabilitation

## 2014-03-27 ENCOUNTER — Inpatient Hospital Stay (HOSPITAL_COMMUNITY): Payer: Self-pay | Admitting: Occupational Therapy

## 2014-03-27 DIAGNOSIS — S72409A Unspecified fracture of lower end of unspecified femur, initial encounter for closed fracture: Secondary | ICD-10-CM

## 2014-03-27 NOTE — Progress Notes (Signed)
38 y.o. male who was admitted from work site on 03/19/14 past being struck by a tractor trailer that backed up and pinned him against a concrete wall. Work up with bilateral acute comminuted fracture of the distal femoral diaphysis and patient underwent ORIF bilateral femurs by Dr. Doran Durand on the same day. Post op TTWB on RLE with KI and WBAT for transfers on LLE.  Subjective/Complaints: Itching just in LEs not with UE Pain relief ok with MSO4  Objective: Vital Signs: Blood pressure 120/64, pulse 93, temperature 98.8 F (37.1 C), temperature source Oral, resp. rate 20, SpO2 98.00%. No results found. Results for orders placed during the hospital encounter of 03/25/14 (from the past 72 hour(s))  CBC WITH DIFFERENTIAL     Status: Abnormal   Collection Time    03/26/14  5:00 AM      Result Value Ref Range   WBC 6.7  4.0 - 10.5 K/uL   RBC 2.66 (*) 4.22 - 5.81 MIL/uL   Hemoglobin 8.0 (*) 13.0 - 17.0 g/dL   HCT 23.7 (*) 39.0 - 52.0 %   MCV 89.1  78.0 - 100.0 fL   MCH 30.1  26.0 - 34.0 pg   MCHC 33.8  30.0 - 36.0 g/dL   RDW 12.9  11.5 - 15.5 %   Platelets 309  150 - 400 K/uL   Neutrophils Relative % 58  43 - 77 %   Neutro Abs 3.9  1.7 - 7.7 K/uL   Lymphocytes Relative 26  12 - 46 %   Lymphs Abs 1.7  0.7 - 4.0 K/uL   Monocytes Relative 10  3 - 12 %   Monocytes Absolute 0.7  0.1 - 1.0 K/uL   Eosinophils Relative 6 (*) 0 - 5 %   Eosinophils Absolute 0.4  0.0 - 0.7 K/uL   Basophils Relative 0  0 - 1 %   Basophils Absolute 0.0  0.0 - 0.1 K/uL  COMPREHENSIVE METABOLIC PANEL     Status: Abnormal   Collection Time    03/26/14  5:00 AM      Result Value Ref Range   Sodium 141  137 - 147 mEq/L   Potassium 3.8  3.7 - 5.3 mEq/L   Chloride 100  96 - 112 mEq/L   CO2 30  19 - 32 mEq/L   Glucose, Bld 98  70 - 99 mg/dL   BUN 14  6 - 23 mg/dL   Creatinine, Ser 1.05  0.50 - 1.35 mg/dL   Calcium 8.5  8.4 - 10.5 mg/dL   Total Protein 6.3  6.0 - 8.3 g/dL   Albumin 2.8 (*) 3.5 - 5.2 g/dL   AST 46  (*) 0 - 37 U/L   ALT 51  0 - 53 U/L   Alkaline Phosphatase 60  39 - 117 U/L   Total Bilirubin 1.0  0.3 - 1.2 mg/dL   GFR calc non Af Amer 88 (*) >90 mL/min   GFR calc Af Amer >90  >90 mL/min   Comment: (NOTE)     The eGFR has been calculated using the CKD EPI equation.     This calculation has not been validated in all clinical situations.     eGFR's persistently <90 mL/min signify possible Chronic Kidney     Disease.   Anion gap 11  5 - 15     HEENT: normal Cardio: RRR and no murmur Resp: CTA B/L and unlabored GI: BS positive and NT, ND Extremity:  Edema BLE look  ok with ACE wraps Skin:   CDI, mepiliex with dried blood Neuro: Alert/Oriented, Normal Sensory and Abnormal Motor 5/5 in BUE, 4/5 bilateral ADF, APF, trace bilateral HF Musc/Skel:  Min swelling bilateral knees Gen NAD   Assessment/Plan: 1. Functional deficits secondary to BIlateral distal femur fx which require 3+ hours per day of interdisciplinary therapy in a comprehensive inpatient rehab setting. Physiatrist is providing close team supervision and 24 hour management of active medical problems listed below. Physiatrist and rehab team continue to assess barriers to discharge/monitor patient progress toward functional and medical goals. FIM: FIM - Bathing Bathing Steps Patient Completed: Chest;Right Arm;Left Arm;Abdomen;Front perineal area;Buttocks;Right upper leg;Left upper leg Bathing: 4: Steadying assist  FIM - Upper Body Dressing/Undressing Upper body dressing/undressing steps patient completed: Thread/unthread right sleeve of pullover shirt/dresss;Thread/unthread left sleeve of pullover shirt/dress;Put head through opening of pull over shirt/dress;Pull shirt over trunk Upper body dressing/undressing: 5: Set-up assist to: Obtain clothing/put away FIM - Lower Body Dressing/Undressing Lower body dressing/undressing steps patient completed: Thread/unthread right pants leg;Thread/unthread left pants leg;Pull pants  up/down;Fasten/unfasten pants Lower body dressing/undressing: 3: Mod-Patient completed 50-74% of tasks  FIM - Toileting Toileting: 0: Activity did not occur  FIM - Radio producer Devices: Elevated toilet seat;Grab bars;Walker Toilet Transfers: 4-To toilet/BSC: Min A (steadying Pt. > 75%);4-From toilet/BSC: Min A (steadying Pt. > 75%)  FIM - Bed/Chair Transfer Bed/Chair Transfer Assistive Devices: Copy: 5: Supine > Sit: Supervision (verbal cues/safety issues);4: Bed > Chair or W/C: Min A (steadying Pt. > 75%);4: Chair or W/C > Bed: Min A (steadying Pt. > 75%)  FIM - Locomotion: Wheelchair Distance: 150 Locomotion: Wheelchair: 5: Travels 150 ft or more: maneuvers on rugs and over door sills with supervision, cueing or coaxing FIM - Locomotion: Ambulation Locomotion: Ambulation Assistive Devices:  (per MD, transfers only) Locomotion: Ambulation: 0: Activity did not occur  Comprehension Comprehension Mode: Auditory Comprehension: 7-Follows complex conversation/direction: With no assist  Expression Expression Mode: Verbal Expression: 7-Expresses complex ideas: With no assist  Social Interaction Social Interaction: 7-Interacts appropriately with others - No medications needed.  Problem Solving Problem Solving: 7-Solves complex problems: Recognizes & self-corrects  Memory Memory: 7-Complete Independence: No helper  Medical Problem List and Plan:   1. Functional deficits secondary to bilateral distal femur fractures-- TTWB Right, WBAT for transfers only on left  2. DVT Prophylaxis/Anticoagulation: Pharmaceutical: Lovenox   3. Pain Management: continue Celebrex daily. Oxycodone prn for pain. Add low dose oxycontin at hs  4. Mood: LCSW to follow for evaluation and support.   5. Neuropsych: This patient is capable of making decisions on her own behalf.   6. Skin/Wound Care: Routine pressure relief measures. Monitor wound daily for  healing.   7. Hypokalemia: Will recheck in am and supplement as indicated.   8. Constipation: cont laxatives   LOS (Days) 2 A FACE TO FACE EVALUATION WAS PERFORMED  KIRSTEINS,ANDREW E 03/27/2014, 8:40 AM

## 2014-03-27 NOTE — Progress Notes (Signed)
Social Work Assessment and Plan Social Work Assessment and Plan  Patient Details  Name: Lee Perez MRN: [id number] Date of Birth: Dec 14, 1975  Today's Date: 03/27/2014  Problem List:  Patient Active Problem List   Diagnosis Date Noted  . Femur fracture 03/25/2014  . Closed fracture of femur, distal end 03/19/2014   Past Medical History:  Past Medical History  Diagnosis Date  . Multiple respiratory allergies   . Wrist fracture, right     as a teenager/casted   Past Surgical History:  Past Surgical History  Procedure Laterality Date  . Mandible fracture surgery    . Femur im nail Bilateral 03/19/2014    Procedure: Bilateral Intermedullary RETROGRADE FEMORAL NAILING;  Surgeon: Toni Arthurs, MD;  Location: [facility];  Service: Orthopedics;  Laterality: Bilateral;   Social History:  reports that he has never smoked. He does not have any smokeless tobacco history on file. He reports that he does not drink alcohol or use illicit drugs.  Family / Support Systems Marital Status: Single Patient Roles: Partner;Parent;Other (Comment) (Employee) Spouse/Significant Other: Lee Perez-girlfriend  [phone] cell Other Supports: Lee Perez-mom  [phone]-cell Anticipated Caregiver: Lee Ability/Limitations of Caregiver: Can provide assist for the short term Caregiver Availability: 24/7 Family Dynamics: Been with Lee for years and are close and can rely upon each other.  He is grateful to have her, never thought it would be for something like this. His MOm will assist with what she can also.  Social History Preferred language: English Religion: Unknown Cultural Background: No issues Education: High School Read: Yes Write: Yes Employment Status: Employed Name of Employer: Psychologist, sport and exercise Return to Work Plans: Plans to return once healed Fish farm manager Issues: Truck hit pt hwile working-Workers Comp beiong pursued and Administrator, arts for  truck Guardian/Conservator: None-according to MD pt is capable of making his own decisions while here.   Abuse/Neglect Physical Abuse: Denies Verbal Abuse: Denies Sexual Abuse: Denies Exploitation of patient/patient's resources: Denies Self-Neglect: Denies  Emotional Status Pt's affect, behavior adn adjustment status: Pt is motivated but experiencing much pain with his fractures.  He is TDWB and WBAT so not much for team to work on besides transfers.  He has always taken care of himself and his daughter and this is a new role for him, but he realizes it is for short term until he is back on his feet. Recent Psychosocial Issues: healthy, insurance coverage for this accident Pyschiatric History: No history deferred depression screen due to coping appropriately at this time Substance Abuse History: No issues  Patient / Family Perceptions, Expectations & Goals Pt/Family understanding of illness & functional limitations: Pt and Lee have a good understanding of his injuries and length of time it will take to heal.  She has been here to observe in therapies and will come back to learn his care.  Both are problem solving ways to make his care easier and more accessible. Premorbid pt/family roles/activities: Employee, Father, boyfriend, Son, etc Anticipated changes in roles/activities/participation: resume Pt/family expectations/goals: Pt states; " I want to be as independent as I can be before I leave here and atleast have some pain control."  Lee states; " I hope he is in less pain, it is hard to see him like this."  [employer]: None Premorbid Home Care/DME Agencies: None Transportation available at discharge: Family and Scientist, product/process development referrals recommended: Neuropsychology  Discharge Planning Living Arrangements: Alone Support Systems: Spouse/significant other;Children;Parent;Friends/neighbors;Church/faith community Type of Residence: Private  residence [employer]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  His daughter is with her Mom at this time, but normally lives with him.  Will work on discharge plans and see if pt is eligible for Medicaid if other insurances do not cover. Social Work Anticipated Follow Up Needs: HH/OP;Support Group  Clinical Impression Pleasant gentleman who is sitting in his wheelchair in pain,but trying to bear through it.  Lee is here and very supportive and willing to assist him. Short length of stay due to only so much he can do with WB issues.  Will work on discharge plans.  Lucy Chris 03/27/2014, 9:29 AM

## 2014-03-27 NOTE — Progress Notes (Signed)
Physical Therapy Session Note  Patient Details  Name: Lee Perez MRN: [id number] Date of Birth: March 02, 1976  Today's Date: 03/27/2014 PT Individual Time: 0830-0932 PT Individual Time Calculation (min): 62 min   Short Term Goals: Week 1:  PT Short Term Goal 1 (Week 1): =LTG's due to ELOS  Skilled Therapeutic Interventions/Progress Updates:   Pt received lying in bed this morning, agreeable to session.  Assisted with donning R KI, however pt able to lift leg on his own for placement.  Educated on how to best place straps across.  Performed bed mobility at mod I level (from hospital bed and ADL apt bed) to better simulate home environment.  Performed w/c mobility at S level (in apt for cues for best set up when getting ready to transfer to bed).  Performed transfers today at S level with use of RW.  Good demonstration of WB status on RLE.  Min cues for negotiation around w/c wheels.  Once in bed in ADL apt, performed bed level exercises with use of flat sheet for active assisted (self).  Performed L heel slides x 10 reps, B LE SLR x 10 reps and hip abd BLE x 10 reps all with use of sheet.  Pt tolerated well and educated to put ice on LE following exercise for swelling/pain management.  Also discussed car transfer and scooting in back seat and then scooting posteriorly into w/c at home.  Will practice this afternoon, pt verbalized understanding.  Pt self propelled w/c back to room at S level.  Therapist donned theraband to RLE around leg rest to prevent RLE from falling off of leg rest.  All needs in reach.   Therapy Documentation Precautions:  Precautions Precautions: Fall Precaution Comments: Knee immobilizer on RLE when OOB Required Braces or Orthoses: Knee Immobilizer - Right Knee Immobilizer - Right: On when out of bed or walking Restrictions Weight Bearing Restrictions: Yes RLE Weight Bearing: Touchdown weight bearing LLE Weight Bearing: Weight bearing as tolerated Other Position/Activity  Restrictions: transfers only   Vital Signs: Therapy Vitals Temp: 98.8 F (37.1 C) Temp src: Oral Pulse Rate: 93 Resp: 20 BP: 120/64 mmHg Oxygen Therapy SpO2: 98 % O2 Device: None (Room air) Pain: Pain Assessment Pain Assessment: 0-10 Pain Score: 5  Pain Type: Surgical pain Pain Location: Leg Pain Orientation: Right;Left Pain Intervention(s): Medication (See eMAR)  See FIM for current functional status  Therapy/Group: Individual Therapy  Vista Deck 03/27/2014, 9:13 AM

## 2014-03-27 NOTE — Progress Notes (Signed)
Social Work Patient ID: Lee Perez, male   DOB: Dec 06, 1975, 38 y.o.   MRN: [id number] Left message with Ana-fiancial counselor regarding pt's room and questions regarding Medicaid application.

## 2014-03-27 NOTE — Progress Notes (Signed)
Physical Therapy Session Note  Patient Details  Name: Lee Perez MRN: [id number] Date of Birth: 24-Aug-1975  Today's Date: 03/27/2014 PT Individual Time: 1430-1507 PT Individual Time Calculation (min): 37 min   Short Term Goals: Week 1:  PT Short Term Goal 1 (Week 1): =LTG's due to ELOS  Skilled Therapeutic Interventions/Progress Updates:   Pt received sitting in w/c in room, agreeable to therapy session.  Pt self propelled w/c to/from therapy gym at mod I level with BUE's.  Skilled session focused on simulated car transfer and getting into back seat safely and scooting posteriorly into w/c when getting home.  Pt able to manage w/c and parts at S level with cues for suggestions on removing leg rests prior to pulling up to car to prevent them from getting in the way and also to ensure room for RW for transfer.  Performed x 2 reps at S level (therapist did assist to ensure shorts did not pull down, but no physical assist) with cues for safety and technique.  Pt able to scoot posteriorly into w/c with cues for safety and management of LEs.  Provided leg lifter to assist with LEs.  Pt felt more comfortable and better able to manage LEs this way.  Propelled back to room and notified nurse tech to provide pt with ice pack for swelling and pain in LLE.   Therapy Documentation Precautions:  Precautions Precautions: Fall Precaution Comments: Knee immobilizer on RLE when OOB Required Braces or Orthoses: Knee Immobilizer - Right Knee Immobilizer - Right: On when out of bed or walking Restrictions Weight Bearing Restrictions: Yes RLE Weight Bearing: Touchdown weight bearing LLE Weight Bearing: Weight bearing as tolerated Other Position/Activity Restrictions: transfers only   Vital Signs: Therapy Vitals Temp: 98 F (36.7 C) Temp src: Oral Pulse Rate: 75 Resp: 17 BP: 105/65 mmHg Patient Position (if appropriate): Sitting Oxygen Therapy SpO2: 98 % O2 Device: None (Room air) Pain: Pain  Assessment Pain Assessment: 0-10 Pain Score: 2  Pain Type: Surgical pain Pain Location: Leg Pain Orientation: Right;Left Pain Intervention(s): Medication (See eMAR)  See FIM for current functional status  Therapy/Group: Individual Therapy  Vista Deck 03/27/2014, 4:03 PM

## 2014-03-27 NOTE — Progress Notes (Signed)
Occupational Therapy Session Note  Patient Details  Name: Lee Perez MRN: [id number] Date of Birth: [birth year]  Today's Date: 03/27/2014 OT Individual Time: 1100-1210 and 1308-6578 OT Individual Time Calculation (min): 70 min and 20 min    Short Term Goals: Week 1:  OT Short Term Goal 1 (Week 1): STG= LTG based on short ELOS  Skilled Therapeutic Interventions/Progress Updates:  Session 1 (1100-1210): Upon entering the room, pt seated in wheelchair with 5/10 c/o pain in B LEs. Pt reporting pain medication recently given. Pt performed UB bathing seated in wheelchair and LB bathing and dressing in bed. Pt obtained all needed items to complete ADL tasks independently from wheelchair level. Pt performed stand pivot transferwith RW to bed from wheelchair with steady assist for balance and safety. Pt performed sit to supine independently. OT educated and demonstrated pt on lateral leans in order to get elastic waist pants down for bathing and dressing. Pt returned  Demonstration and removed pants for bathing. KI on when pt moving in bed. Wrapping has been removed from B legs prior to session. Bandaged appear to have some drainage with greater on L LE. RN is aware and reports bandages will be changed soon. Therapist assisted in removal on KI while in bed in order to clean the area. Pt able to threat elastic waist shorts onto B LEs and engage in lateral leans with verbal cues to pull up B hips. Pt reported slight increase in pain when threading pants and would benefit from AE in order to increase I with self care. Pt seated in wheelchair upon exiting the room with tray table in front of him. Call bell and all needed items within reach this session.   Session 2 [id number]): Upon entering the room, pt seated in wheelchair. Pt with 3/10 c/o pain in L LE this session. Pt propelled wheelchair to tub room ~ 300 feet round trip in order to increase endurance and B UE strength for functional mobility. OT educated and  demonstrated tub bench transfer as well as DME use and importance for future. OT discussed this option as being next step for pt once he has been cleared to get into shower. OT also discussed fall risks within the bathroom. OT recommended placement of safety treads in tub, purchase or borrow of tub bench, and 1 non slip bath mat placed outside of tub. Pt verbalized understanding this session.   Therapy Documentation Precautions:  Precautions Precautions: Fall Precaution Comments: Knee immobilizer on RLE when OOB Required Braces or Orthoses: Knee Immobilizer - Right Knee Immobilizer - Right: On when out of bed or walking Restrictions Weight Bearing Restrictions: Yes RLE Weight Bearing: Touchdown weight bearing LLE Weight Bearing: Weight bearing as tolerated Other Position/Activity Restrictions: transfers only Pain: Pain Assessment Pain Assessment: 0-10 Pain Score: 5  Pain Type: Surgical pain Pain Location: Leg Pain Orientation: Right;Left Pain Descriptors / Indicators: Aching Pain Intervention(s): Medication (See eMAR)  See FIM for current functional status  Therapy/Group: Individual Therapy  Lowella Grip 03/27/2014, 4:46 PM

## 2014-03-28 ENCOUNTER — Encounter (HOSPITAL_COMMUNITY): Payer: Self-pay | Admitting: Occupational Therapy

## 2014-03-28 ENCOUNTER — Inpatient Hospital Stay (HOSPITAL_COMMUNITY): Payer: Self-pay | Admitting: Occupational Therapy

## 2014-03-28 ENCOUNTER — Inpatient Hospital Stay (HOSPITAL_COMMUNITY): Payer: Self-pay | Admitting: Rehabilitation

## 2014-03-28 DIAGNOSIS — M25569 Pain in unspecified knee: Secondary | ICD-10-CM | POA: Diagnosis present

## 2014-03-28 DIAGNOSIS — K5900 Constipation, unspecified: Secondary | ICD-10-CM | POA: Diagnosis present

## 2014-03-28 DIAGNOSIS — D62 Acute posthemorrhagic anemia: Secondary | ICD-10-CM | POA: Diagnosis present

## 2014-03-28 MED ORDER — ENOXAPARIN (LOVENOX) PATIENT EDUCATION KIT
PACK | Freq: Once | Status: DC
  Filled 2014-03-28: qty 1

## 2014-03-28 MED ORDER — ENOXAPARIN SODIUM 40 MG/0.4ML ~~LOC~~ SOLN: 40.0000 mg | SUBCUTANEOUS | Status: DC

## 2014-03-28 MED ORDER — MORPHINE SULFATE 15 MG PO TABS: 15.0000 mg | ORAL_TABLET | Freq: Four times a day (QID) | ORAL | Status: DC | PRN

## 2014-03-28 MED ORDER — CELECOXIB 200 MG PO CAPS: 200.0000 mg | ORAL_CAPSULE | Freq: Every day | ORAL | Status: DC

## 2014-03-28 MED ORDER — TRAMADOL HCL 50 MG PO TABS: 50.0000 mg | ORAL_TABLET | Freq: Four times a day (QID) | ORAL | Status: DC | PRN

## 2014-03-28 MED ORDER — MORPHINE SULFATE ER 30 MG PO TBCR: 30.0000 mg | EXTENDED_RELEASE_TABLET | Freq: Two times a day (BID) | ORAL | Status: DC

## 2014-03-28 MED ORDER — MORPHINE SULFATE 15 MG PO TABS: 15.0000 mg | ORAL_TABLET | ORAL | Status: DC | PRN

## 2014-03-28 MED ORDER — SENNA 8.6 MG PO TABS: 3.0000 | ORAL_TABLET | Freq: Three times a day (TID) | ORAL | Status: DC

## 2014-03-28 NOTE — Progress Notes (Signed)
Educated patient on why he will be on lovenox, how to do it, and things to look for while on this medication.  I also showed him the [email or web address].com website.

## 2014-03-28 NOTE — Progress Notes (Signed)
Occupational Therapy Session Note  Patient Details  Name: Lee Perez MRN: [id number] Date of Birth: 08-09-1975  Today's Date: 03/28/2014 OT Individual Time: 4098-1191 and 1255-1355 OT Individual Time Calculation (min): 60 min and 60 min   Short Term Goals: Week 1:  OT Short Term Goal 1 (Week 1): STG= LTG based on short ELOS  Skilled Therapeutic Interventions/Progress Updates:  Session 1 (4782-9562): Upon entering the room, pt supine in bed. Pt with 4/10 c/o pain in R LE this session and reports RN gave medication prior to therapist arrival. Pt obtained all needed items for bathing and dressing from wheelchair level. OT session with focus on AE training for ADLs, patient education, and functional transfers. Pt performed bathing seated in bed for safety while KI not on. Pt utilizing long handled sponge to wash B lower legs and feet. Pt able to donn and doff KI independently while in bed. Pt performed lateral leans to the L and R in order to push down shorts. OT educated and demonstrated use of LH reacher to remove socks and shorts. Pt returned demonstration with supervision. Pt washed buttocks and peri area while supine in bed. Utilizing long handled reacher to donn elastic waist shorts this session. Pt able to use hands and LH reacher to put on B shoes. Pt transferred with scoot pivot from wheelchair to bed with supervision and verbal cues for safety. Pt then positioned self in front of sink for grooming tasks independently. Pt making significant progress towards goals this session.   Session 2 [id number]): Pt with 4/10 c/o pain this session in B  LEs and reports recently given medications. Upon entering the room, pt seated in wheelchair. DME equipment for home as been delivered this session. RW adjusted to appropriate height. Drop arm BSC chair adjusted to proper height. OT demonstrated how to adjust height as well as how to change "pot" portion as well as how it can be adjusted how to fit over  toilet. OT educated pt on scoot pivot and set up from drop arm BSC transfer from wheelchair. Pt performed with supervision and verbal cues for proper technique. Pt then transferred with Mod I from wheelchair to bed and was able to set up wheelchair for transfer independently. OT educated pt on additional fall risk and set up of various areas in home in order to make safer with wheelchair use. Pt verbalized understanding. OT demonstrated bed > BSC transfer.  Pt transferred from bed to drop arm commode chair with supervision and verbal cues for proper technique.   Therapy Documentation Precautions:  Precautions Precautions: Fall Precaution Comments: Knee immobilizer on RLE when OOB Required Braces or Orthoses: Knee Immobilizer - Right Knee Immobilizer - Right: On when out of bed or walking Restrictions Weight Bearing Restrictions: Yes RLE Weight Bearing: Touchdown weight bearing LLE Weight Bearing: Weight bearing as tolerated Other Position/Activity Restrictions: transfers only  See FIM for current functional status  Therapy/Group: Individual Therapy  Lowella Grip 03/28/2014, 9:45 AM

## 2014-03-28 NOTE — Progress Notes (Signed)
Physical Therapy Session Note  Patient Details  Name: Lee Perez MRN: [id number] Date of Birth: [birth year]  Today's Date: 03/28/2014 PT Individual Time: 0830-0930 PT Individual Time Calculation (min): 60 min   Short Term Goals: Week 1:  PT Short Term Goal 1 (Week 1): =LTG's due to ELOS  Skilled Therapeutic Interventions/Progress Updates:   Pt received sitting in w/c in room, requesting to don shoes prior to session.  Pt able to don socks independently, however did require min A for R shoe getting over heel due to pt wanting to leave RLE on leg rest.  Discussed stair negotiation with pt and while MD present to get clearance for pt to hop up 1-2 steps to get into house.  MD states ok to work on to get into house, just ensure WB status on RLE.  Pt self propelled x 200' at mod I level in controlled and ADL apt to better simulate home.  Pt able to manage w/c parts throughout with min cues for set up of w/c when transferring to couch and also when removing leg rests at home, ensure that they remain in reach at all times.  Pt verbalized understanding.  Had long discussion about stairs to get into apt.  Pt states that he can pull around in w/c over grass to get to single step then landing then another step into apt.  Therefore practiced single step with RW and had pt slowly pivot backwards (more UE use than LE) and then discussed negotiating up/down one more step.  Pt feels that he can do this and also discussed that when he stands, he will need to have his GF or father fold up chair and place inside so that he can sit as soon as he does the other step.  Pt verbalized understanding and performed at S level this morning.  Discussed that he should practice again in the am with father present to see how to perform.  Ended session with transfer to/from furniture at S level with min cues on w/c set up.  Pt returned to room and left with all needs in reach.   Therapy Documentation Precautions:   Precautions Precautions: Fall Precaution Comments: Knee immobilizer on RLE when OOB Required Braces or Orthoses: Knee Immobilizer - Right Knee Immobilizer - Right: On when out of bed or walking Restrictions Weight Bearing Restrictions: Yes RLE Weight Bearing: Touchdown weight bearing LLE Weight Bearing: Weight bearing as tolerated Other Position/Activity Restrictions: transfers only   Pain: Pain Assessment Pain Assessment: 0-10 Pain Score: 7  Pain Type: Acute pain Pain Location: Leg Pain Orientation: Right Pain Descriptors / Indicators: Sore Pain Onset: On-going Pain Intervention(s): Medication (See eMAR)   Locomotion : Wheelchair Mobility Distance: 200   See FIM for current functional status  Therapy/Group: Individual Therapy  Vista Deck 03/28/2014, 10:37 AM

## 2014-03-28 NOTE — Progress Notes (Signed)
Social Work Discharge Note Discharge Note  The overall goal for the admission was met for:   Discharge location: [place] [facility] HR SUPERVISION   Length of Stay: Yes-4 DAYS  Discharge activity level: Yes-SUPERVISION/MOD/I-WHEELCHAIR LEVEL  Home/community participation: Yes  Services provided included: MD, RD, PT, OT, RN, CM, Pharmacy and SW  Financial Services: Other: SELF PAY-[employer] PENDING  Follow-up services arranged: Home Health: [facility]-PT,RN, DME: [facility]-WHEELCHAIR, ROLLING WALKER, DROP-ARM BSC and Patient/Family has no preference for HH/DME agencies  Comments (or additional information):GAVE PT MATCH LETTER, BUDDY TO PICK UP MEDICINES TODAY.  HAS APPLIED FOR MEDICAID YESTERDAY.  FAMILY EDUCATION COMPLETED. PT LOOKING INTO WORKERS COMP, MAY HIRE LAWYER.  Patient/Family verbalized understanding of follow-up arrangements: Yes  Individual responsible for coordination of the follow-up plan: SELF  Confirmed correct DME delivered: Elease Hashimoto 03/28/2014    Elease Hashimoto

## 2014-03-28 NOTE — Discharge Instructions (Signed)
Inpatient Rehab Discharge Instructions  Lee Perez Discharge date and time: 03/28/14   Activities/Precautions/ Functional Status: Activity: activity as tolerated Diet: regular diet Wound Care: keep wound clean and dry  Functional status:  ___ No restrictions     ___ Walk up steps independently ___ 24/7 supervision/assistance   ___ Walk up steps with assistance _X__ Intermittent supervision/assistance  ___ Bathe/dress independently ___ Walk with walker     __X_ Bathe/dress with assistance ___ Walk Independently    ___ Shower independently ___ Walk with assistance    ___ Shower with assistance ___ No alcohol     ___ Return to work/school ________  Special Instructions: 1. Keep Knee immobilizer on right leg when out of bed. 2. Touch down weight on right leg.  3.  Will need laxatives as long as you are using narcotics.   COMMUNITY REFERRALS UPON DISCHARGE:    Home Health:   PT, RN  Agency:[facility] Phone:[phone] Date of last service:03/29/2014  Medical Equipment/Items Ordered:WHEELCHAIR, [name]  Agency/Supplier:[facility]    [phone] Other:APPLYING FOR MEDICAID    My questions have been answered and I understand these instructions. I will adhere to these goals and the provided educational materials after my discharge from the hospital.  Patient/Caregiver Signature _______________________________ Date __________  Clinician Signature _______________________________________ Date __________  Please bring this form and your medication list with you to all your follow-up doctor's appointments.

## 2014-03-28 NOTE — IPOC Note (Signed)
Overall Plan of Care [facility]) Patient Details Name: Avish Torry MRN: [id number] DOB: 03/09/1976  Admitting Diagnosis: B DISTAL FEMUR FXS  Hospital Problems: Principal Problem:   Femur fracture Active Problems:   Acute blood loss anemia   Pain in joint, lower leg   Unspecified constipation     Functional Problem List: Nursing    PT Balance;Motor;Pain;Safety  OT Balance;Endurance;Motor;Safety;Pain  SLP    TR         Basic ADL's: OT Grooming;Bathing;Dressing;Toileting;Other (comment)     Advanced  ADL's: OT Other (comment) (girlfriend will be performing once pt discharges)     Transfers: PT Bed Mobility;Bed to Chair;Car;Furniture  OT Toilet     Locomotion: PT Ambulation;Wheelchair Mobility     Additional Impairments: OT None  SLP        TR      Anticipated Outcomes Item Anticipated Outcome  Self Feeding    Swallowing      Basic self-care  Mod I  Toileting  Mod I   Bathroom Transfers Mod I for toileting on BSC  Bowel/Bladder     Transfers  Mod I   Locomotion  none set due to MD orders  Communication     Cognition     Pain     Safety/Judgment      Therapy Plan: PT Intensity: Minimum of 1-2 x/day ,45 to 90 minutes PT Frequency: 5 out of 7 days PT Duration Estimated Length of Stay: 3 days OT Intensity: Minimum of 1-2 x/day, 45 to 90 minutes OT Frequency: 5 out of 7 days OT Duration/Estimated Length of Stay: 3-5 days         Team Interventions: Nursing Interventions    PT interventions Discharge planning;DME/adaptive equipment instruction;Functional mobility training;Pain management;Patient/family education;Therapeutic Activities;Therapeutic Exercise;UE/LE Strength taining/ROM;Wheelchair propulsion/positioning  OT Interventions Balance/vestibular training;Discharge planning;DME/adaptive equipment instruction;Pain management;Psychosocial support;Therapeutic Activities;UE/LE Strength taining/ROM;UE/LE Coordination activities;Therapeutic  Exercise;Self Care/advanced ADL retraining;Patient/family education  SLP Interventions    TR Interventions    SW/CM Interventions Discharge Planning;Psychosocial Support;Patient/Family Education    Team Discharge Planning: Destination: PT-Home ,OT- Home , SLP-  Projected Follow-up: PT-None (none until able to WB on LEs), OT-  None, SLP-  Projected Equipment Needs: PT-3 in 1 bedside comode;Wheelchair cushion (measurements);Wheelchair (measurements);Rolling walker with 5" wheels, OT- 3 in 1 bedside comode;Tub/shower bench, SLP-  Equipment Details: PT-bari RW if possible, OT-  Patient/family involved in discharge planning: PT- Patient,  OT-Patient, SLP-   MD ELOS: 3-5 days Medical Rehab Prognosis:  Excellent Assessment: The patient has been admitted for CIR therapies with the diagnosis of bilateral distal femur fx's. The team will be addressing functional mobility, strength, stamina, balance, safety, adaptive techniques and equipment, self-care, bowel and bladder mgt, patient and caregiver education, ortho precautions. Goals have been set at mod I with mobility and self-care.    Ranelle Oyster, MD, FAAPMR      See Team Conference Notes for weekly updates to the plan of care

## 2014-03-28 NOTE — Progress Notes (Signed)
Patient had questions regarding being unable to get the 30 day supply of meds at the pharmacy. Social Worker contacted with no return call. Patient advised to call back Monday morning. Continue with plan of care.

## 2014-03-28 NOTE — Progress Notes (Signed)
Social Work Patient ID: Lee Perez, male   DOB: Apr 26, 1976, 38 y.o.   MRN: [id number] Met with pt to give him the Match form for assistance with medicines and discuss discharge plans.  Have arranged follow up via [facility] for PT & RN. He has a buddy coming to take his Prescriptions filled at the OP pharmacy.  DME being delivered today.  He is also applying for Medicaid and papers started.  He will check with his employer regarding workers comp coverage. Encouraged him to possibly get a Chief Executive Officer, someone to look out for his rights.  Since he is concerned about income  And coverage during his healing process.  He feels prepared to go home tomorrow. Education completed with girlfriend and pt.

## 2014-03-28 NOTE — Plan of Care (Signed)
Problem: RH PAIN MANAGEMENT Goal: RH STG PAIN MANAGED AT OR BELOW PT'S PAIN GOAL Pain goal less than 3  Outcome: Not Progressing Pain remain about 5 or more out of 10

## 2014-03-28 NOTE — Progress Notes (Signed)
38 y.o. male who was admitted from work site on 03/19/14 past being struck by a tractor trailer that backed up and pinned him against a concrete wall. Work up with bilateral acute comminuted fracture of the distal femoral diaphysis and patient underwent ORIF bilateral femurs by Dr. Doran Durand on the same day. Post op TTWB on RLE with KI and WBAT for transfers on LLE.  Subjective/Complaints: Itching better, some leg "numbness" Pain relief ok with MSO4  Objective: Vital Signs: Blood pressure 123/58, pulse 93, temperature 98.9 F (37.2 C), temperature source Oral, resp. rate 18, SpO2 98.00%. No results found. Results for orders placed during the hospital encounter of 03/25/14 (from the past 72 hour(s))  CBC WITH DIFFERENTIAL     Status: Abnormal   Collection Time    03/26/14  5:00 AM      Result Value Ref Range   WBC 6.7  4.0 - 10.5 K/uL   RBC 2.66 (*) 4.22 - 5.81 MIL/uL   Hemoglobin 8.0 (*) 13.0 - 17.0 g/dL   HCT 23.7 (*) 39.0 - 52.0 %   MCV 89.1  78.0 - 100.0 fL   MCH 30.1  26.0 - 34.0 pg   MCHC 33.8  30.0 - 36.0 g/dL   RDW 12.9  11.5 - 15.5 %   Platelets 309  150 - 400 K/uL   Neutrophils Relative % 58  43 - 77 %   Neutro Abs 3.9  1.7 - 7.7 K/uL   Lymphocytes Relative 26  12 - 46 %   Lymphs Abs 1.7  0.7 - 4.0 K/uL   Monocytes Relative 10  3 - 12 %   Monocytes Absolute 0.7  0.1 - 1.0 K/uL   Eosinophils Relative 6 (*) 0 - 5 %   Eosinophils Absolute 0.4  0.0 - 0.7 K/uL   Basophils Relative 0  0 - 1 %   Basophils Absolute 0.0  0.0 - 0.1 K/uL  COMPREHENSIVE METABOLIC PANEL     Status: Abnormal   Collection Time    03/26/14  5:00 AM      Result Value Ref Range   Sodium 141  137 - 147 mEq/L   Potassium 3.8  3.7 - 5.3 mEq/L   Chloride 100  96 - 112 mEq/L   CO2 30  19 - 32 mEq/L   Glucose, Bld 98  70 - 99 mg/dL   BUN 14  6 - 23 mg/dL   Creatinine, Ser 1.05  0.50 - 1.35 mg/dL   Calcium 8.5  8.4 - 10.5 mg/dL   Total Protein 6.3  6.0 - 8.3 g/dL   Albumin 2.8 (*) 3.5 - 5.2 g/dL   AST  46 (*) 0 - 37 U/L   ALT 51  0 - 53 U/L   Alkaline Phosphatase 60  39 - 117 U/L   Total Bilirubin 1.0  0.3 - 1.2 mg/dL   GFR calc non Af Amer 88 (*) >90 mL/min   GFR calc Af Amer >90  >90 mL/min   Comment: (NOTE)     The eGFR has been calculated using the CKD EPI equation.     This calculation has not been validated in all clinical situations.     eGFR's persistently <90 mL/min signify possible Chronic Kidney     Disease.   Anion gap 11  5 - 15     HEENT: normal Cardio: RRR and no murmur Resp: CTA B/L and unlabored GI: BS positive and NT, ND Extremity:  Edema BLE look ok with  ACE wraps Skin:   CDI, mepiliex with dried blood Neuro: Alert/Oriented, Normal Sensory and Abnormal Motor 5/5 in BUE, 4/5 bilateral ADF, APF, trace bilateral HF Musc/Skel:  Min swelling bilateral knees Gen NAD   Assessment/Plan: 1. Functional deficits secondary to BIlateral distal femur fx which require 3+ hours per day of interdisciplinary therapy in a comprehensive inpatient rehab setting. Physiatrist is providing close team supervision and 24 hour management of active medical problems listed below. Physiatrist and rehab team continue to assess barriers to discharge/monitor patient progress toward functional and medical goals. Ready for D/C in am FIM: FIM - Bathing Bathing Steps Patient Completed: Chest;Right Arm;Left Arm;Abdomen;Front perineal area;Buttocks;Right upper leg;Left upper leg;Left lower leg (including foot) Bathing: 4: Min-Patient completes 8-9 37f10 parts or 75+ percent  FIM - Upper Body Dressing/Undressing Upper body dressing/undressing steps patient completed: Thread/unthread right sleeve of pullover shirt/dresss;Thread/unthread left sleeve of pullover shirt/dress;Put head through opening of pull over shirt/dress;Pull shirt over trunk Upper body dressing/undressing: 5: Supervision: Safety issues/verbal cues FIM - Lower Body Dressing/Undressing Lower body dressing/undressing steps patient  completed: Thread/unthread right pants leg;Thread/unthread left pants leg;Pull pants up/down;Fasten/unfasten pants;Don/Doff left shoe Lower body dressing/undressing: 4: Min-Patient completed 75 plus % of tasks  FIM - Toileting Toileting: 0: Activity did not occur  FIM - TRadio producerDevices: Elevated toilet seat;Grab bars;Walker Toilet Transfers: 4-To toilet/BSC: Min A (steadying Pt. > 75%);4-From toilet/BSC: Min A (steadying Pt. > 75%)  FIM - Bed/Chair Transfer Bed/Chair Transfer Assistive Devices: WCopy 6: Supine > Sit: No assist;6: Sit > Supine: No assist;5: Bed > Chair or W/C: Supervision (verbal cues/safety issues);5: Chair or W/C > Bed: Supervision (verbal cues/safety issues)  FIM - Locomotion: Wheelchair Distance: 150 Locomotion: Wheelchair: 5: Travels 150 ft or more: maneuvers on rugs and over door sills with supervision, cueing or coaxing FIM - Locomotion: Ambulation Locomotion: Ambulation Assistive Devices:  (per MD, transfers only) Locomotion: Ambulation: 0: Activity did not occur  Comprehension Comprehension Mode: Auditory Comprehension: 7-Follows complex conversation/direction: With no assist  Expression Expression Mode: Verbal Expression: 7-Expresses complex ideas: With no assist  Social Interaction Social Interaction: 7-Interacts appropriately with others - No medications needed.  Problem Solving Problem Solving: 7-Solves complex problems: Recognizes & self-corrects  Memory Memory: 7-Complete Independence: No helper  Medical Problem List and Plan:   1. Functional deficits secondary to bilateral distal femur fractures-- TTWB Right, WBAT for transfers only on left  2. DVT Prophylaxis/Anticoagulation: Pharmaceutical: Lovenox   3. Pain Management: continue Celebrex daily. MS Contin plus MSIR for pain on D/C 4. Mood: LCSW to follow for evaluation and support.   5. Neuropsych: This patient is capable of making  decisions on her own behalf.   6. Skin/Wound Care: Routine pressure relief measures. Monitor wound daily for healing.   7. Hypokalemia: Will recheck in am and supplement as indicated.   8. Constipation: cont laxatives   LOS (Days) 3 A FACE TO FACE EVALUATION WAS PERFORMED  KIRSTEINS,ANDREW E 03/28/2014, 8:51 AM

## 2014-03-28 NOTE — Discharge Summary (Addendum)
Physician Discharge Summary  Patient ID: Lee Perez MRN: [id number] DOB/AGE: 07/26/1975 38 y.o.  Admit date: 03/25/2014 Discharge date: 03/28/2014  Discharge Diagnoses:  Principal Problem:   Femur fracture Active Problems:   Acute blood loss anemia   Pain in joint, lower leg   Unspecified constipation   Discharged Condition: Stable  Labs:  Basic Metabolic Panel:  Recent Labs Lab 03/26/14 0500  NA 141  K 3.8  CL 100  CO2 30  GLUCOSE 98  BUN 14  CREATININE 1.05  CALCIUM 8.5    CBC: CBC Latest Ref Rng 03/26/2014 03/19/2014  WBC 4.0 - 10.5 K/uL 6.7 8.9  Hemoglobin 13.0 - 17.0 g/dL 8.0(L) 15.0  Hematocrit 39.0 - 52.0 % 23.7(L) 41.5  Platelets 150 - 400 K/uL 309 265     CBG: No results found for this basename: GLUCAP,  in the last 168 hours  Brief HPI:   Lee Perez is a 38 y.o. male who was admitted from work site on 03/19/14 past being struck by a tractor trailer that backed up and pinned him against a concrete wall. Work up with bilateral acute comminuted fracture of the distal femoral diaphysis and patient underwent ORIF bilateral femurs by Dr. Victorino Dike on the same day. Post op TTWB on RLE with KI and WBAT for transfers on LLE.  Pain control is  improving and patient working on transfers and is able to maintain NWB RLE   Hospital Course: Lee Perez was admitted to rehab 03/25/2014 for inpatient therapies to consist of PT, ST and OT at least three hours five days a week. Past admission physiatrist, therapy team and rehab RN have worked together to provide customized collaborative inpatient rehab. He had increase in pain levels with increase in activity and MS contin was added for long acting pain control. He is using MSIR for break thorough with good relief with current regimen.  Blood pressures have been controlled. He was maintained on SQ lovenox for DVT prophylaxis and is to continue on this at discharge.  BLE incisions are healing well without signs or symptoms of  infection. He has progressed to intermittent supervision at wheelchair level. He will continue to receive HHPT and HHRN by [facility] past discharge.    Rehab course: During patient's stay in rehab weekly brief team conference was held to monitor patient's progress, set goals and discuss barriers to discharge. Patient has had improvement in activity tolerance as well as ability to compensate for deficits. He is able to complete bathing and dressing tasks independently with use of  AE and requires supervision for toilet transfers.  He is able to maintain TDWB RLE and is independent for scoot pivot transfers. He requires supervision for car transfers and to navigate  stairs.  Family education was done with his father and patient is able to instruct helpers independently on assistance needed.    Disposition:  Home   Diet: Regular.   Special Instructions: 1. Wear Knee immobilizer RLE when out of bed. 2. Continue Touch down weight RLE.  3. Continue to use laxatives while on narcotics.     Medication List    STOP taking these medications       DSS 100 MG Caps     oxyCODONE 5 MG immediate release tablet  Commonly known as:  Oxy IR/ROXICODONE      TAKE these medications       celecoxib 200 MG capsule  Commonly known as:  CELEBREX  Take 1 capsule (200 mg total) by  mouth daily.     enoxaparin 40 MG/0.4ML injection  Commonly known as:  LOVENOX  Inject 0.4 mLs (40 mg total) into the skin daily.     morphine 30 MG 12 hr tablet--Rx 42 pills  Commonly known as:  MS CONTIN  Take 1 tablet (30 mg total) by mouth every 12 (twelve) hours.     morphine 15 MG tablet--Rx 108 pills  Commonly known as:  MSIR  Take 1 tablet (15 mg total) by mouth every 4 (four) hours as needed for severe pain. Max 4 pills/day     senna 8.6 MG Tabs tablet  Commonly known as:  SENOKOT  Take 3 tablets (25.8 mg total) by mouth 3 (three) times daily. For constipation     traMADol 50 MG tablet--Rx 75 pills    Commonly known as:  ULTRAM  Take 1 tablet (50 mg total) by mouth every 6 (six) hours as needed for moderate pain.           Follow-up Information   Follow up with Toni Arthurs, MD. Call in 3 days. (for post op appointment)    Specialty:  Orthopedic Surgery   Contact information:   [address] [place] [address] [phone]       Call Erick Colace, MD. (As needed)    Specialty:  Physical Medicine and Rehabilitation   Contact information:   [address] [place] [address] [place] [address] [phone]       Signed: Jacquelynn Cree 03/28/2014, 7:49 PM

## 2014-03-29 ENCOUNTER — Inpatient Hospital Stay (HOSPITAL_COMMUNITY): Payer: Self-pay

## 2014-03-29 ENCOUNTER — Encounter (HOSPITAL_COMMUNITY): Payer: Self-pay | Admitting: Occupational Therapy

## 2014-03-29 NOTE — Progress Notes (Signed)
Occupational Therapy Discharge Summary  Patient Details  Name: Lee Perez MRN: [id number] Date of Birth: [birth year]  Today's Date: 03/29/2014 OT Individual Time: 6761-9509 OT Individual Time Calculation (min): 60 min    Patient has met 7 of 7 long term goals due to improved activity tolerance and ability to compensate for deficits.  Patient to discharge at overall Modified Independent level.  Patient's care partner is independent to provide the necessary supervision for toilet transfers assistance at discharge.     Recommendation:  Patient does not require OT follow up services at this time.  Equipment: drop arm commode chair  Reasons for discharge: treatment goals met  Patient/family agrees with progress made and goals achieved: Yes  OT intervention: Pt reporting 4/10 pain in B legs upon arrival. Medication given per RN. Pt demonstrated the skills required to perform ADLs in bed as well as seated in wheelchair with Mod I. Long handled sponge and reacher utilized in order for pt to be successful. Pt able to obtain all clothing and items needed with increased time. Pt performed scoot pivot transfer from bed to wheelchair with Mod I as well as setting up wheelchair independently for transfer. Pt also able to set up leg rest and donn and doff KI as appropriate during ADL session. Grooming performed seated at sink side in chair with Mod I.  Pt with no further questions. All goals met this session. Pt discharged from inpatient rehabilitation occupational therapy services.   OT Discharge Precautions/Restrictions  Precautions Precautions: Fall Precaution Comments: Knee immobilizer on RLE when OOB Required Braces or Orthoses: Knee Immobilizer - Right Knee Immobilizer - Right: On when out of bed or walking Restrictions Weight Bearing Restrictions: Yes RLE Weight Bearing: Touchdown weight bearing LLE Weight Bearing: Weight bearing as tolerated Other Position/Activity Restrictions:  transfers only Pain Pain Assessment Pain Assessment: 0-10 Pain Score: 4  Pain Type: Surgical pain Pain Location: Leg Pain Orientation: Right;Left Pain Descriptors / Indicators: Aching Pain Onset: On-going Patients Stated Pain Goal: 3 Pain Intervention(s): Medication (See eMAR);RN made aware Multiple Pain Sites: No Vision/Perception  Vision- History Baseline Vision/History: No visual deficits Patient Visual Report: No change from baseline  Cognition Overall Cognitive Status: Within Functional Limits for tasks assessed Arousal/Alertness: Awake/alert Orientation Level: Oriented X4 Attention: Alternating Alternating Attention: Appears intact Memory: Appears intact Awareness: Appears intact Problem Solving: Appears intact Safety/Judgment: Appears intact Comments: Pt asks appropriate questions and has recall from previous sessions. Follows weight bearing precautions.  Sensation Sensation Light Touch: Impaired by gross assessment (B LEs) Stereognosis: Not tested Hot/Cold: Appears Intact Proprioception: Appears Intact Coordination Gross Motor Movements are Fluid and Coordinated: Yes Fine Motor Movements are Fluid and Coordinated: Yes Finger Nose Finger Test: WFLs Motor  Motor Motor - Discharge Observations: Pt continues to have increase pain and decreased strength in B LEs. Pt able to utilize strong B UEs for transfers and to assist with occupational performance.  Mobility  Bed Mobility Bed Mobility: Supine to Sit Supine to Sit: 7: Independent  Trunk/Postural Assessment  Cervical Assessment Cervical Assessment: Within Functional Limits Thoracic Assessment Thoracic Assessment: Within Functional Limits Lumbar Assessment Lumbar Assessment: Within Functional Limits Postural Control Postural Control: Within Functional Limits  Balance Balance Balance Assessed: Yes Static Sitting Balance Static Sitting - Balance Support: Feet supported;No upper extremity supported Static  Sitting - Level of Assistance: 7: Independent Dynamic Sitting Balance Dynamic Sitting - Balance Support: Feet supported;No upper extremity supported Dynamic Sitting - Level of Assistance: 7: Independent Extremity/Trunk Assessment RUE Assessment RUE Assessment: Within  Functional Limits LUE Assessment LUE Assessment: Within Functional Limits  See FIM for current functional status  Phineas Semen 03/29/2014, 11:47 AM

## 2014-03-29 NOTE — Progress Notes (Signed)
38 y.o. male who was admitted from work site on 03/19/14 past being struck by a tractor trailer that backed up and pinned him against a concrete wall. Work up with bilateral acute comminuted fracture of the distal femoral diaphysis and patient underwent ORIF bilateral femurs by Dr. Victorino Dike on the same day. Post op TTWB on RLE with KI and WBAT for transfers on LLE.  Subjective/Complaints: Ready to go home  Objective: Vital Signs: Blood pressure 121/64, pulse 99, temperature 98.7 F (37.1 C), temperature source Oral, resp. rate 20, SpO2 99.00%. No results found. No results found for this or any previous visit (from the past 72 hour(s)).   HEENT: normal Cardio: RRR and no murmur Resp: CTA B/L and unlabored GI: BS positive and NT, ND Extremity:  Edema BLE look ok with ACE wraps Skin:   CDI, mepiliex with dried blood Neuro: Alert/Oriented, Normal Sensory and Abnormal Motor 5/5 in BUE, 4/5 bilateral ADF, APF, trace bilateral HF Musc/Skel:  Min swelling bilateral knees Gen NAD   Assessment/Plan: 1. Functional deficits secondary to BIlateral distal femur fx which require 3+ hours per day of interdisciplinary therapy in a comprehensive inpatient rehab setting. Physiatrist is providing close team supervision and 24 hour management of active medical problems listed below. Physiatrist and rehab team continue to assess barriers to discharge/monitor patient progress toward functional and medical goals.  Home today  FIM: FIM - Bathing Bathing Steps Patient Completed: Chest;Front perineal area;Right lower leg (including foot);Left lower leg (including foot);Buttocks;Right Arm;Left Arm;Right upper leg;Abdomen;Left upper leg Bathing: 5: Supervision: Safety issues/verbal cues  FIM - Upper Body Dressing/Undressing Upper body dressing/undressing steps patient completed: Thread/unthread right sleeve of pullover shirt/dresss;Thread/unthread left sleeve of pullover shirt/dress;Put head through opening of  pull over shirt/dress;Pull shirt over trunk Upper body dressing/undressing: 6: More than reasonable amount of time FIM - Lower Body Dressing/Undressing Lower body dressing/undressing steps patient completed: Thread/unthread right pants leg;Thread/unthread left pants leg;Pull pants up/down;Fasten/unfasten pants;Don/Doff right sock;Don/Doff left sock;Don/Doff left shoe;Don/Doff right shoe Lower body dressing/undressing: 5: Supervision: Safety issues/verbal cues  FIM - Toileting Toileting steps completed by patient: Adjust clothing prior to toileting;Performs perineal hygiene;Adjust clothing after toileting Toileting: 5: Supervision: Safety issues/verbal cues (per report patient manages pants with lateral leans, S)  FIM - Archivist Transfers Assistive Devices: Bedside commode (drop arm commode) Toilet Transfers: 5-To toilet/BSC: Supervision (verbal cues/safety issues);5-From toilet/BSC: Supervision (verbal cues/safety issues) (scoot pivot from wheelchair and then from bed)  FIM - Bed/Chair Transfer Bed/Chair Transfer Assistive Devices: Manufacturing systems engineer Transfer: 6: Bed > Chair or W/C: No assist;6: Chair or W/C > Bed: No assist  FIM - Locomotion: Wheelchair Distance: 200 Locomotion: Wheelchair: 6: Travels 150 ft or more, turns around, maneuvers to table, bed or toilet, negotiates 3% grade: maneuvers on rugs and over door sills independently FIM - Locomotion: Ambulation Locomotion: Ambulation Assistive Devices:  (per MD, transfers only) Locomotion: Ambulation: 0: Activity did not occur  Comprehension Comprehension Mode: Auditory Comprehension: 7-Follows complex conversation/direction: With no assist  Expression Expression Mode: Verbal Expression: 7-Expresses complex ideas: With no assist  Social Interaction Social Interaction: 7-Interacts appropriately with others - No medications needed.  Problem Solving Problem Solving: 6-Solves complex problems: With extra  time  Memory Memory: 7-Complete Independence: No helper  Medical Problem List and Plan:   1. Functional deficits secondary to bilateral distal femur fractures-- TTWB Right, WBAT for transfers only on left  2. DVT Prophylaxis/Anticoagulation: Pharmaceutical: Lovenox   3. Pain Management: continue Celebrex daily. MS Contin plus MSIR for pain  on D/C 4. Mood: LCSW to follow for evaluation and support.   5. Neuropsych: This patient is capable of making decisions on her own behalf.   6. Skin/Wound Care: Routine pressure relief measures. Monitor wound daily for healing.   7. Hypokalemia: Will recheck in am and supplement as indicated.   8. Constipation: cont laxatives   LOS (Days) 4 A FACE TO FACE EVALUATION WAS PERFORMED  SWARTZ,ZACHARY T 03/29/2014, 8:23 AM

## 2014-03-29 NOTE — Progress Notes (Signed)
Physical Therapy Session Note  Patient Details  Name: Lee Perez MRN: [id number] Date of Birth: 09-02-1975  Today's Date: 03/29/2014 PT Individual Time: 0915-1015 PT Individual Time Calculation (min): 60 min   Short Term Goals: Week 1:  PT Short Term Goal 1 (Week 1): =LTG's due to ELOS  Skilled Therapeutic Interventions/Progress Updates:   tx focused on family ed with pt's father, w/c set-up with rental pt is taking home W/c propulsion modified independent on unit.  Up/down 1 curb step with RW to simulate home entry x 2, once with PT, once with father, supervision.  Pt remembered sequencing and only needed one cue for safe R foot placement all the way onto curb (after stepping up first with L foot, when ascending backwards.) PT instructed pt's father in folding up w/c, and transporting it into house while pt standing on landing entering house.  Simulated car transfer into passenger side of backseat of a sedan, with father. Supervision to enter car with RW as pt's size made it difficult to get w/c close enough to scoot laterally, supervision to scoot backwards car> w/c.    Pt transferred backwards into rental w/c.  Leg rests adjusted for his long legs, R KI.  Mechanism for R leg rest lowering/raising did not work well, and length of leg rests on this Drive w/c are short compared to pt's leg length.  PT attempted to adjust leg rest as well as possible, but eventually decided that pt's nurse should be informed and [facility] should be notified.  PT checked in with pt's RN later in day, and [facility] had gotten raising/lowering mechanism to work, before pt d/c'd.    Therapy Documentation Precautions:  Precautions Precautions: Fall Precaution Comments: Knee immobilizer on RLE when OOB Required Braces or Orthoses: Knee Immobilizer - Right Knee Immobilizer - Right: On when out of bed or walking Restrictions Weight Bearing Restrictions: Yes RLE Weight Bearing: Touchdown  weight bearing LLE Weight Bearing: Weight bearing as tolerated Other Position/Activity Restrictions: transfers only   Pain: Pain Assessment Pain Assessment: 0-10 (pt requested before d/c) Pain Score: 5  Pain Type: Surgical pain Pain Location: Leg Pain Orientation: Right;Left Pain Descriptors / Indicators: Aching Pain Frequency: Constant Pain Onset: On-going Patients Stated Pain Goal: 2 Pain Intervention(s): Medication (See eMAR) Multiple Pain Sites: No   Locomotion : Wheelchair Mobility Distance: 150    See FIM for current functional status  Therapy/Group: Individual Therapy  Tenna Lacko 03/29/2014, 4:56 PM

## 2014-03-29 NOTE — Progress Notes (Signed)
1455 03/29/14 nsg Pt discharged to home per wheelchair accompanied by tech and family; discharge papers complete; all questions answered by RN re: home pain med; pt. understood.

## 2014-03-30 DIAGNOSIS — R9389 Abnormal findings on diagnostic imaging of other specified body structures: Secondary | ICD-10-CM | POA: Diagnosis not present

## 2014-03-30 DIAGNOSIS — Z4801 Encounter for change or removal of surgical wound dressing: Secondary | ICD-10-CM | POA: Diagnosis not present

## 2014-03-30 DIAGNOSIS — Z7901 Long term (current) use of anticoagulants: Secondary | ICD-10-CM | POA: Diagnosis not present

## 2014-03-30 DIAGNOSIS — S72009D Fracture of unspecified part of neck of unspecified femur, subsequent encounter for closed fracture with routine healing: Secondary | ICD-10-CM | POA: Diagnosis not present

## 2014-04-28 ENCOUNTER — Ambulatory Visit: Payer: No Typology Code available for payment source | Attending: Orthopedic Surgery | Admitting: Physical Therapy

## 2014-04-28 DIAGNOSIS — S7290XD Unspecified fracture of unspecified femur, subsequent encounter for closed fracture with routine healing: Secondary | ICD-10-CM | POA: Diagnosis present

## 2014-04-28 DIAGNOSIS — M25561 Pain in right knee: Secondary | ICD-10-CM | POA: Diagnosis not present

## 2014-04-28 DIAGNOSIS — R262 Difficulty in walking, not elsewhere classified: Secondary | ICD-10-CM | POA: Diagnosis not present

## 2014-05-01 ENCOUNTER — Ambulatory Visit: Payer: No Typology Code available for payment source | Admitting: Physical Therapy

## 2014-05-01 DIAGNOSIS — S7290XD Unspecified fracture of unspecified femur, subsequent encounter for closed fracture with routine healing: Secondary | ICD-10-CM | POA: Diagnosis not present

## 2014-05-06 ENCOUNTER — Ambulatory Visit: Payer: No Typology Code available for payment source | Admitting: Physical Therapy

## 2014-05-06 DIAGNOSIS — S7290XD Unspecified fracture of unspecified femur, subsequent encounter for closed fracture with routine healing: Secondary | ICD-10-CM | POA: Diagnosis not present

## 2014-05-09 ENCOUNTER — Ambulatory Visit: Payer: No Typology Code available for payment source | Admitting: Physical Therapy

## 2014-05-09 DIAGNOSIS — S7290XD Unspecified fracture of unspecified femur, subsequent encounter for closed fracture with routine healing: Secondary | ICD-10-CM | POA: Diagnosis not present

## 2014-05-13 ENCOUNTER — Ambulatory Visit: Payer: No Typology Code available for payment source | Admitting: Physical Therapy

## 2014-05-13 DIAGNOSIS — S7290XD Unspecified fracture of unspecified femur, subsequent encounter for closed fracture with routine healing: Secondary | ICD-10-CM | POA: Diagnosis not present

## 2014-05-15 ENCOUNTER — Ambulatory Visit: Payer: No Typology Code available for payment source | Admitting: Physical Therapy

## 2014-05-15 DIAGNOSIS — S7290XD Unspecified fracture of unspecified femur, subsequent encounter for closed fracture with routine healing: Secondary | ICD-10-CM | POA: Diagnosis not present

## 2014-05-20 ENCOUNTER — Ambulatory Visit: Payer: No Typology Code available for payment source | Admitting: Physical Therapy

## 2014-05-20 DIAGNOSIS — S7290XD Unspecified fracture of unspecified femur, subsequent encounter for closed fracture with routine healing: Secondary | ICD-10-CM | POA: Diagnosis not present

## 2014-05-22 ENCOUNTER — Ambulatory Visit: Payer: No Typology Code available for payment source | Admitting: Physical Therapy

## 2014-05-22 DIAGNOSIS — S7290XD Unspecified fracture of unspecified femur, subsequent encounter for closed fracture with routine healing: Secondary | ICD-10-CM | POA: Diagnosis not present

## 2014-05-27 ENCOUNTER — Ambulatory Visit: Payer: No Typology Code available for payment source | Attending: Orthopedic Surgery | Admitting: Physical Therapy

## 2014-05-27 DIAGNOSIS — S7290XD Unspecified fracture of unspecified femur, subsequent encounter for closed fracture with routine healing: Secondary | ICD-10-CM | POA: Diagnosis not present

## 2014-05-27 DIAGNOSIS — R262 Difficulty in walking, not elsewhere classified: Secondary | ICD-10-CM | POA: Diagnosis not present

## 2014-05-27 DIAGNOSIS — M25561 Pain in right knee: Secondary | ICD-10-CM | POA: Diagnosis not present

## 2014-05-29 ENCOUNTER — Ambulatory Visit: Payer: No Typology Code available for payment source | Admitting: Physical Therapy

## 2014-05-29 DIAGNOSIS — S7290XD Unspecified fracture of unspecified femur, subsequent encounter for closed fracture with routine healing: Secondary | ICD-10-CM | POA: Diagnosis not present

## 2014-06-03 ENCOUNTER — Ambulatory Visit: Payer: No Typology Code available for payment source | Admitting: Physical Therapy

## 2014-06-03 DIAGNOSIS — S7290XD Unspecified fracture of unspecified femur, subsequent encounter for closed fracture with routine healing: Secondary | ICD-10-CM | POA: Diagnosis not present

## 2014-06-05 ENCOUNTER — Ambulatory Visit: Payer: No Typology Code available for payment source | Admitting: Physical Therapy

## 2014-06-05 DIAGNOSIS — S7290XD Unspecified fracture of unspecified femur, subsequent encounter for closed fracture with routine healing: Secondary | ICD-10-CM | POA: Diagnosis not present

## 2014-06-10 ENCOUNTER — Ambulatory Visit: Payer: No Typology Code available for payment source | Admitting: Physical Therapy

## 2014-06-10 DIAGNOSIS — S7290XD Unspecified fracture of unspecified femur, subsequent encounter for closed fracture with routine healing: Secondary | ICD-10-CM | POA: Diagnosis not present

## 2014-06-12 ENCOUNTER — Ambulatory Visit: Payer: No Typology Code available for payment source | Admitting: Physical Therapy

## 2014-06-12 DIAGNOSIS — S7290XD Unspecified fracture of unspecified femur, subsequent encounter for closed fracture with routine healing: Secondary | ICD-10-CM | POA: Diagnosis not present

## 2014-06-16 ENCOUNTER — Ambulatory Visit: Payer: No Typology Code available for payment source | Admitting: Physical Therapy

## 2014-06-16 DIAGNOSIS — S7290XD Unspecified fracture of unspecified femur, subsequent encounter for closed fracture with routine healing: Secondary | ICD-10-CM | POA: Diagnosis not present

## 2014-06-17 ENCOUNTER — Ambulatory Visit: Payer: No Typology Code available for payment source | Admitting: Physical Therapy

## 2014-06-17 DIAGNOSIS — S7290XD Unspecified fracture of unspecified femur, subsequent encounter for closed fracture with routine healing: Secondary | ICD-10-CM | POA: Diagnosis not present

## 2014-06-24 ENCOUNTER — Ambulatory Visit: Payer: No Typology Code available for payment source | Attending: Orthopedic Surgery | Admitting: Physical Therapy

## 2014-06-24 DIAGNOSIS — S7290XD Unspecified fracture of unspecified femur, subsequent encounter for closed fracture with routine healing: Secondary | ICD-10-CM | POA: Insufficient documentation

## 2014-06-24 DIAGNOSIS — R262 Difficulty in walking, not elsewhere classified: Secondary | ICD-10-CM | POA: Diagnosis not present

## 2014-06-24 DIAGNOSIS — M25561 Pain in right knee: Secondary | ICD-10-CM | POA: Diagnosis not present

## 2014-06-26 ENCOUNTER — Ambulatory Visit: Payer: No Typology Code available for payment source | Admitting: Physical Therapy

## 2014-06-26 DIAGNOSIS — S7290XD Unspecified fracture of unspecified femur, subsequent encounter for closed fracture with routine healing: Secondary | ICD-10-CM | POA: Diagnosis not present

## 2014-06-30 ENCOUNTER — Ambulatory Visit: Payer: No Typology Code available for payment source | Admitting: Physical Therapy

## 2014-06-30 DIAGNOSIS — S7290XD Unspecified fracture of unspecified femur, subsequent encounter for closed fracture with routine healing: Secondary | ICD-10-CM | POA: Diagnosis not present

## 2014-07-03 ENCOUNTER — Encounter: Payer: Self-pay | Admitting: Physical Therapy

## 2014-07-04 ENCOUNTER — Ambulatory Visit: Payer: No Typology Code available for payment source | Admitting: Physical Therapy

## 2014-07-04 DIAGNOSIS — S7290XD Unspecified fracture of unspecified femur, subsequent encounter for closed fracture with routine healing: Secondary | ICD-10-CM | POA: Diagnosis not present

## 2014-07-08 ENCOUNTER — Ambulatory Visit: Payer: No Typology Code available for payment source | Admitting: Physical Therapy

## 2014-07-08 DIAGNOSIS — S7290XD Unspecified fracture of unspecified femur, subsequent encounter for closed fracture with routine healing: Secondary | ICD-10-CM | POA: Diagnosis not present

## 2014-07-11 ENCOUNTER — Ambulatory Visit: Payer: No Typology Code available for payment source | Admitting: Physical Therapy

## 2014-07-11 DIAGNOSIS — S7290XD Unspecified fracture of unspecified femur, subsequent encounter for closed fracture with routine healing: Secondary | ICD-10-CM | POA: Diagnosis not present

## 2014-07-14 ENCOUNTER — Ambulatory Visit: Payer: No Typology Code available for payment source | Admitting: Physical Therapy

## 2014-07-14 DIAGNOSIS — S7290XD Unspecified fracture of unspecified femur, subsequent encounter for closed fracture with routine healing: Secondary | ICD-10-CM | POA: Diagnosis not present

## 2014-07-17 ENCOUNTER — Ambulatory Visit: Payer: No Typology Code available for payment source | Admitting: Physical Therapy

## 2014-07-17 DIAGNOSIS — S7290XD Unspecified fracture of unspecified femur, subsequent encounter for closed fracture with routine healing: Secondary | ICD-10-CM | POA: Diagnosis not present

## 2015-08-11 ENCOUNTER — Encounter: Payer: Self-pay | Admitting: Physical Therapy

## 2015-08-11 ENCOUNTER — Ambulatory Visit: Payer: Medicaid Other | Attending: Anesthesiology | Admitting: Physical Therapy

## 2015-08-11 DIAGNOSIS — M25561 Pain in right knee: Secondary | ICD-10-CM | POA: Insufficient documentation

## 2015-08-11 DIAGNOSIS — M25562 Pain in left knee: Secondary | ICD-10-CM | POA: Insufficient documentation

## 2015-08-11 NOTE — Therapy (Signed)
[facility] [address] [place], [place], [address] Phone: [phone]   Fax:  [phone]  Physical Therapy Evaluation  Patient Details  Name: Lee Perez MRN: [id number] Date of Birth: 01/06/1976 Referring Provider: Eather Colas  Encounter Date: 08/11/2015      PT End of Session - 08/11/15 1003    Visit Number 1   Number of Visits 1   Authorization Type Medicaid   PT Start Time 0930   PT Stop Time 1015   PT Time Calculation (min) 45 min   Activity Tolerance Patient tolerated treatment well   Behavior During Therapy [facility] for tasks assessed/performed      Past Medical History  Diagnosis Date  . Multiple respiratory allergies   . Wrist fracture, right     as a teenager/casted    Past Surgical History  Procedure Laterality Date  . Mandible fracture surgery    . Femur im nail Bilateral 03/19/2014    Procedure: Bilateral Intermedullary RETROGRADE FEMORAL NAILING;  Surgeon: Toni Arthurs, MD;  Location: [facility] OR;  Service: Orthopedics;  Laterality: Bilateral;    There were no vitals filed for this visit.  Visit Diagnosis:  Bilateral knee pain - Plan: PT plan of care cert/re-cert      Subjective Assessment - 08/11/15 0936    Subjective Patient sustained bilateral femur fractures in August 2015.  He underwent PT here up until December of 2015.  He did very well, when he started he was in a w/c and when he left he could walk without assistive device.  He reports that since we d/c'd him  he continued to do some exercise, but reports that over the past 6 months he has had some increased difficulty getting up from sitting with some increased in the bilateral knees   Limitations Lifting;Standing;Walking;House hold activities   Patient Stated Goals get up from sitting without difficulty or pain   Currently in Pain? Yes   Pain Score 8    Pain Location Knee   Pain Orientation Right;Left   Pain Descriptors / Indicators Aching   Pain Type Chronic pain   Pain Onset More than a month ago   Pain Frequency Intermittent   Aggravating Factors  get up from sitting pain is an 8/10 in the knees, sitting in one position for a long period of time will cause incresaed stiffness   Pain Relieving Factors at rest and with pain meds can be 0/10   Effect of Pain on Daily Activities difficulty getting up from sitting            [facility] PT Assessment - 08/11/15 0001    Assessment   Medical Diagnosis bilateral knee pain   Referring Provider Heag   Onset Date/Surgical Date 07/02/15   Prior Therapy yes about 14 months ago   Precautions   Precautions None   Balance Screen   Has the patient fallen in the past 6 months No   Has the patient had a decrease in activity level because of a fear of falling?  No   Is the patient reluctant to leave their home because of a fear of falling?  No   Home Environment   Additional Comments does his own housework   Prior Function   Level of Independence Independent   Vocation Unemployed   Leisure reports that he is going to gym 5x/week, doing strength exercises   AROM   Overall AROM Comments Left knee AROM 0-115 degrees flexion, AROM of  the right knee 0-100 degrees flexion   Strength   Overall Strength Comments hips 5/5, knees 4+/5   Flexibility   Soft Tissue Assessment /Muscle Length --  pretty flexible except quads are very tight   Palpation   Palpation comment significant crepitus with bilateral knee flexion and extension, some tenderness in the right medial knee   Transfers   Comments typically has to use his hands to get up from sitting, without hands very audible crepitus in the knees   Ambulation/Gait   Gait Comments Has antalgic gait on the right                   [facility] Adult PT Treatment/Exercise - 08/11/15 0001    Modalities   Modalities Electrical Stimulation   Electrical Stimulation   Electrical Stimulation Location right knee   Electrical Stimulation Action  IFC   Electrical Stimulation Parameters tolerance   Electrical Stimulation Goals Pain                PT Education - 08/11/15 1002    Education provided Yes   Education Details quad stretches, and went over safety in the gym to decrease open chain stress on knee with exercises   Person(s) Educated Patient   Methods Explanation;Demonstration   Comprehension Verbalized understanding;Returned demonstration                    Plan - 08/11/15 1003    Clinical Impression Statement Patient is only allowed one visit per year by Medicaid.  He has knee pain and difficulty getting up from sitting, has significant crepitus with knee flexion and extension.    Pt will benefit from skilled therapeutic intervention in order to improve on the following deficits Pain   PT Frequency One time visit   PT Treatment/Interventions ADLs/Self Care Home Management;Electrical Stimulation;Therapeutic exercise   PT Next Visit Plan Gave him info on TENS, quad stretches and safety in gym.  Due to his insurance I can only see him 1x/year   Consulted and Agree with Plan of Care Patient         Problem List Patient Active Problem List   Diagnosis Date Noted  . Acute blood loss anemia 03/28/2014  . Pain in joint, lower leg 03/28/2014  . Unspecified constipation 03/28/2014  . Femur fracture (HCC) 03/25/2014  . Closed fracture of femur, distal end (HCC) 03/19/2014    Jearld Lesch., PT 08/11/2015, 10:08 AM  [facility] [address] [place], [place], [address] Phone: [phone]   Fax:  [phone]  Name: Lee Perez MRN: [id number] Date of Birth: [birth year]

## 2015-10-15 ENCOUNTER — Emergency Department (HOSPITAL_BASED_OUTPATIENT_CLINIC_OR_DEPARTMENT_OTHER)
Admission: EM | Admit: 2015-10-15 | Discharge: 2015-10-16 | Disposition: A | Discharge status: Home / Self Care | Payer: Medicaid Other | Attending: Emergency Medicine | Admitting: Emergency Medicine

## 2015-10-15 ENCOUNTER — Encounter (HOSPITAL_BASED_OUTPATIENT_CLINIC_OR_DEPARTMENT_OTHER): Payer: Self-pay | Admitting: *Deleted

## 2015-10-15 ENCOUNTER — Emergency Department (HOSPITAL_BASED_OUTPATIENT_CLINIC_OR_DEPARTMENT_OTHER): Payer: Medicaid Other

## 2015-10-15 DIAGNOSIS — Z8709 Personal history of other diseases of the respiratory system: Secondary | ICD-10-CM | POA: Insufficient documentation

## 2015-10-15 DIAGNOSIS — Z791 Long term (current) use of non-steroidal anti-inflammatories (NSAID): Secondary | ICD-10-CM | POA: Insufficient documentation

## 2015-10-15 DIAGNOSIS — Z8781 Personal history of (healed) traumatic fracture: Secondary | ICD-10-CM | POA: Insufficient documentation

## 2015-10-15 DIAGNOSIS — M25561 Pain in right knee: Secondary | ICD-10-CM | POA: Insufficient documentation

## 2015-10-15 DIAGNOSIS — G8929 Other chronic pain: Secondary | ICD-10-CM | POA: Insufficient documentation

## 2015-10-15 DIAGNOSIS — Z7901 Long term (current) use of anticoagulants: Secondary | ICD-10-CM | POA: Insufficient documentation

## 2015-10-15 NOTE — ED Provider Notes (Signed)
CSN: [id number]     Arrival date & time 10/15/15  2111 History   First MD Initiated Contact with Patient 10/15/15 2309     Chief Complaint  Patient presents with  . Leg Pain     (Consider location/radiation/quality/duration/timing/severity/associated sxs/prior Treatment) HPI Comments: Patient with history of bilateral femur fractures with intermedullary femoral nailing in 2015. He has had chronic pain in both legs, and is currently in pain management.  Developed increasing pain in lateral aspect of right knee 2 days ago, with limited improvement in pain with current management plan.  Patient is a 40 y.o. male presenting with leg pain. The history is provided by the patient and medical records. No language interpreter was used.  Leg Pain Location:  Knee Time since incident:  2 days Knee location:  R knee Pain details:    Quality:  Aching and throbbing   Duration:  2 days   Timing:  Constant   Progression:  Worsening Chronicity:  Chronic Dislocation: no   Prior injury to area:  Yes Associated symptoms: no fever     Past Medical History  Diagnosis Date  . Multiple respiratory allergies   . Wrist fracture, right     as a teenager/casted   Past Surgical History  Procedure Laterality Date  . Mandible fracture surgery    . Femur im nail Bilateral 03/19/2014    Procedure: Bilateral Intermedullary RETROGRADE FEMORAL NAILING;  Surgeon: Toni Arthurs, MD;  Location: [facility] OR;  Service: Orthopedics;  Laterality: Bilateral;   Family History  Problem Relation Age of Onset  . Diabetes Mother    Social History  Substance Use Topics  . Smoking status: Never Smoker   . Smokeless tobacco: None  . Alcohol Use: No    Review of Systems  Constitutional: Negative for fever.  Musculoskeletal: Positive for arthralgias.  All other systems reviewed and are negative.     Allergies  Review of patient's allergies indicates no known allergies.  Home Medications   Prior to Admission  medications   Medication Sig Start Date End Date Taking? Authorizing Provider  celecoxib (CELEBREX) 200 MG capsule Take 1 capsule (200 mg total) by mouth daily. 03/28/14   Evlyn Kanner Love, PA-C  enoxaparin (LOVENOX) 40 MG/0.4ML injection Inject 0.4 mLs (40 mg total) into the skin daily. 03/28/14   Jacquelynn Cree, PA-C  morphine (MS CONTIN) 30 MG 12 hr tablet Take 1 tablet (30 mg total) by mouth every 12 (twelve) hours. 03/28/14   Jacquelynn Cree, PA-C  morphine (MSIR) 15 MG tablet Take 1 tablet (15 mg total) by mouth every 6 (six) hours as needed for severe pain. Max 4 pills/day 03/28/14   Evlyn Kanner Love, PA-C  senna (SENOKOT) 8.6 MG TABS tablet Take 3 tablets (25.8 mg total) by mouth 3 (three) times daily. For constipation 03/28/14   Evlyn Kanner Love, PA-C  traMADol (ULTRAM) 50 MG tablet Take 1 tablet (50 mg total) by mouth every 6 (six) hours as needed for moderate pain. 03/28/14   Evlyn Kanner Love, PA-C   BP 119/57 mmHg  Pulse 82  Temp(Src) 98.2 F (36.8 C) (Oral)  Resp 20  Ht  (1.88 m)  Wt 117.935 kg  BMI 33.37 kg/m2  SpO2 99% Physical Exam  Constitutional: He is oriented to person, place, and time. He appears well-developed and well-nourished.  HENT:  Head: Normocephalic.  Eyes: Pupils are equal, round, and reactive to light.  Neck: Neck supple.  Cardiovascular: Normal rate and regular rhythm.  Pulmonary/Chest: Effort normal and breath sounds normal.  Abdominal: Soft. Bowel sounds are normal.  Musculoskeletal: He exhibits tenderness. He exhibits no edema.       Right knee: He exhibits normal range of motion. Tenderness found. Lateral joint line tenderness noted.  Neurological: He is alert and oriented to person, place, and time.  Skin: Skin is warm and dry.  Psychiatric: He has a normal mood and affect.  Nursing note and vitals reviewed.   ED Course  Procedures (including critical care time) Labs Review Labs Reviewed - No data to display  Imaging Review Dg Knee Complete 4 Views  Right  10/15/2015  CLINICAL DATA:  Right knee pain with flexion for 2 days.  No injury. EXAM: RIGHT KNEE - COMPLETE 4+ VIEW COMPARISON:  03/19/2014 FINDINGS: Old healed fracture deformity of the distal femoral shaft at the meta diaphysis. Hardware is not completely visualized but visualized hardware appears intact. No evidence of acute fracture or dislocation in the right knee. No focal bone lesion or bone destruction. No significant effusion. IMPRESSION: No acute bony abnormalities. Old internal fixation of old healed fracture deformity of the distal femur. Electronically Signed   By: Burman NievesWilliam  Stevens M.D.   On: 10/15/2015 23:41   I have personally reviewed and evaluated these images and lab results as part of my medical decision-making.   EKG Interpretation None     Radiology results reviewed and shared with patient. No acute findings. Patient will be discharged home to continue established pain management plan. Recommend follow-up with PCP/Orthopedist. Return precautions discussed. MDM   Final diagnoses:  None    Right knee pain.    Felicie Mornavid Zayah Keilman, NP 10/16/15 [id number]  Tomasita CrumbleAdeleke Oni, MD 10/16/15 [phone]

## 2015-10-15 NOTE — ED Notes (Signed)
NP at bedside.

## 2015-10-15 NOTE — ED Notes (Addendum)
Pain in his legs x 2 days. Hx of fractures in 2015. He goes to the pain clinic but does not go back until next month.

## 2015-10-16 NOTE — Discharge Instructions (Signed)
Knee Pain Knee pain is a common problem. It can have many causes. The pain often goes away by following your doctor's home care instructions. Treatment for ongoing pain will depend on the cause of your pain. If your knee pain continues, more tests may be needed to diagnose your condition. Tests may include X-rays or other imaging studies of your knee. HOME CARE  Take medicines only as told by your doctor.  Rest your knee and keep it raised (elevated) while you are resting.  Do not do things that cause pain or make your pain worse.  Avoid activities where both feet leave the ground at the same time, such as running, jumping rope, or doing jumping jacks.  Apply ice to the knee area:  Put ice in a plastic bag.  Place a towel between your skin and the bag.  Leave the ice on for 20 minutes, 2-3 times a day.  Ask your doctor if you should wear an elastic knee support.  Sleep with a pillow under your knee.  Lose weight if you are overweight. Being overweight can make your knee hurt more.  Do not use any tobacco products, including cigarettes, chewing tobacco, or electronic cigarettes. If you need help quitting, ask your doctor. Smoking may slow the healing of any bone and joint problems that you may have. GET HELP IF:  Your knee pain does not stop, it changes, or it gets worse.  You have a fever along with knee pain.  Your knee gives out or locks up.  Your knee becomes more swollen. GET HELP RIGHT AWAY IF:   Your knee feels hot to the touch.  You have chest pain or trouble breathing.   This information is not intended to replace advice given to you by your health care provider. Make sure you discuss any questions you have with your health care provider.   Document Released: 10/07/2008 Document Revised: 08/01/2014 Document Reviewed: 09/11/2013 Elsevier Interactive Patient Education 2016 [employer]  Continue with your established pain management plan.  Follow-up with your  PCP and/or orthopedist as discussed.

## 2016-01-26 ENCOUNTER — Encounter (HOSPITAL_BASED_OUTPATIENT_CLINIC_OR_DEPARTMENT_OTHER): Payer: Self-pay | Admitting: *Deleted

## 2016-01-26 ENCOUNTER — Emergency Department (HOSPITAL_BASED_OUTPATIENT_CLINIC_OR_DEPARTMENT_OTHER)
Admission: EM | Admit: 2016-01-26 | Discharge: 2016-01-26 | Disposition: A | Discharge status: Home / Self Care | Payer: Medicaid Other | Attending: Emergency Medicine | Admitting: Emergency Medicine

## 2016-01-26 DIAGNOSIS — L02419 Cutaneous abscess of limb, unspecified: Secondary | ICD-10-CM

## 2016-01-26 DIAGNOSIS — Z79899 Other long term (current) drug therapy: Secondary | ICD-10-CM | POA: Diagnosis not present

## 2016-01-26 DIAGNOSIS — L02416 Cutaneous abscess of left lower limb: Secondary | ICD-10-CM | POA: Insufficient documentation

## 2016-01-26 MED ORDER — SULFAMETHOXAZOLE-TRIMETHOPRIM 800-160 MG PO TABS: 1.0000 | ORAL_TABLET | Freq: Two times a day (BID) | ORAL | Status: AC

## 2016-01-26 MED ORDER — FUROSEMIDE 10 MG/ML IJ SOLN: 40.0000 mg | Freq: Once | INTRAMUSCULAR | Status: DC

## 2016-01-26 MED ORDER — SULFAMETHOXAZOLE-TRIMETHOPRIM 800-160 MG PO TABS
1.0000 | ORAL_TABLET | Freq: Once | ORAL | Status: AC
  Administered 2016-01-26: 1 via ORAL
  Filled 2016-01-26: qty 1

## 2016-01-26 MED ORDER — LIDOCAINE-EPINEPHRINE 2 %-1:100000 IJ SOLN
20.0000 mL | Freq: Once | INTRAMUSCULAR | Status: AC
  Administered 2016-01-26: 20 mL
  Filled 2016-01-26: qty 1

## 2016-01-26 NOTE — Discharge Instructions (Signed)
Please read and follow all provided instructions.  Your diagnoses today include:  1. Abscess of leg     Tests performed today include:  Vital signs. See below for your results today.   Medications prescribed:   Bactrim (trimethoprim/sulfamethoxazole) - antibiotic  You have been prescribed an antibiotic medicine: take the entire course of medicine even if you are feeling better. Stopping early can cause the antibiotic not to work.  Take any prescribed medications only as directed.   Home care instructions:   Follow any educational materials contained in this packet  Follow-up instructions: Return to the Emergency Department in 48 hours for a recheck if your symptoms are not significantly improved.  Return instructions:  Return to the Emergency Department if you have:  Fever  Worsening symptoms  Worsening pain  Worsening swelling  Redness of the skin that moves away from the affected area, especially if it streaks away from the affected area   Any other emergent concerns  Your vital signs today were: BP 133/80 mmHg   Pulse 98   Temp(Src) 98.6 F (37 C) (Oral)   Resp 18   Ht 6\' 2"  (1.88 m)   Wt 111.131 kg   BMI 31.44 kg/m2   SpO2 99% If your blood pressure (BP) was elevated above 135/85 this visit, please have this repeated by your doctor within one month. --------------

## 2016-01-26 NOTE — ED Notes (Signed)
Abscess to his left upper leg. Pain, redness, swelling since yesterday. He feels like he got bit by a spider.

## 2016-01-26 NOTE — ED Provider Notes (Signed)
CSN: [id number]     Arrival date & time 01/26/16  2136 History  By signing my name below, I, [facility] LLCMarrissa Washington, attest that this documentation has been prepared under the direction and in the presence of RaytheonJosh Keita Demarco PA-C. Electronically Signed: Randell PatientMarrissa Washington, ED Scribe. 01/26/2016. 10:03 PM.   Chief Complaint  Patient presents with  . Abscess    The history is provided by the patient. No language interpreter was used.   HPI Comments: Lee Perez is a 40 y.o. male who presents to the Emergency Department complaining of constant, gradually increasing in swelling, erythema, and pain abscess to his left anterior thigh that he noticed yesterday. Pt states that he has pressed the affected area causing the abscess to leak a large amount of pus. He takes gabapentin, muscle relaxer, Percocet, and phentermine regularly. NKDA. Denies fevers.  Past Medical History  Diagnosis Date  . Multiple respiratory allergies   . Wrist fracture, right     as a teenager/casted   Past Surgical History  Procedure Laterality Date  . Mandible fracture surgery    . Femur im nail Bilateral 03/19/2014    Procedure: Bilateral Intermedullary RETROGRADE FEMORAL NAILING;  Surgeon: Toni ArthursJohn Hewitt, MD;  Location: [facility] OR;  Service: Orthopedics;  Laterality: Bilateral;   Family History  Problem Relation Age of Onset  . Diabetes Mother    Social History  Substance Use Topics  . Smoking status: Never Smoker   . Smokeless tobacco: None  . Alcohol Use: No    Review of Systems  Constitutional: Negative for fever.  Gastrointestinal: Negative for nausea and vomiting.  Skin: Negative for color change.       Positive for abscess  Hematological: Negative for adenopathy.      Allergies  Review of patient's allergies indicates no known allergies.  Home Medications   Prior to Admission medications   Medication Sig Start Date End Date Taking? Authorizing Provider  Cholecalciferol (VITAMIN D PO) Take by mouth.   Yes  Historical Provider, MD  cyclobenzaprine (FLEXERIL) 10 MG tablet Take 10 mg by mouth 3 (three) times daily as needed for muscle spasms.   Yes Historical Provider, MD  GABAPENTIN PO Take by mouth.   Yes Historical Provider, MD  Oxycodone-Acetaminophen (PERCOCET PO) Take by mouth.   Yes Historical Provider, MD  phentermine 37.5 MG capsule Take 37.5 mg by mouth every morning.   Yes Historical Provider, MD  sulfamethoxazole-trimethoprim (BACTRIM DS,SEPTRA DS) 800-160 MG tablet Take 1 tablet by mouth 2 (two) times daily. 01/26/16 02/02/16  Renne CriglerJoshua Rangel Echeverri, PA-C   BP 133/80 mmHg  Pulse 98  Temp(Src) 98.6 F (37 C) (Oral)  Resp 18  Ht 6\' 2"  (1.88 m)  Wt 245 lb (111.131 kg)  BMI 31.44 kg/m2  SpO2 99% Physical Exam  Constitutional: He appears well-developed and well-nourished. No distress.  HENT:  Head: Normocephalic and atraumatic.  Eyes: Conjunctivae are normal.  Neck: Normal range of motion. Neck supple.  Cardiovascular: Normal rate.   Pulmonary/Chest: Effort normal. No respiratory distress.  Musculoskeletal: Normal range of motion.  Neurological: He is alert.  Skin: Skin is warm and dry.  Patients with 2 cm x 3 cm area of induration to the left anterior thigh consistent with abscess. No active drainage. Several centimeters of surrounding erythema consistent with cellulitis.  Psychiatric: He has a normal mood and affect. His behavior is normal.  Nursing note and vitals reviewed.   ED Course  Procedures   DIAGNOSTIC STUDIES: Oxygen Saturation is 99% on RA, normal  by my interpretation.    COORDINATION OF CARE: 9:55 PM Will return to perform I&D. Will prescribe antibiotics. Advised at home treatment with warm compresses and warm water soaks. Discussed treatment plan with pt at bedside and pt agreed to plan.  INCISION AND DRAINAGE Performed by: Carolee RotaGEIPLE,Sanah Kraska S Consent: Verbal consent obtained. Risks and benefits: risks, benefits and alternatives were discussed Type: abscess  Body  area: left anterior thigh  Anesthesia: local infiltration  Incision was made with a scalpel.  Local anesthetic: lidocaine 2% with epinephrine  Anesthetic total: 2 ml  Complexity: complex Blunt dissection to break up loculations  Drainage: purulent  Drainage amount: small Packing material: none Patient tolerance: Patient tolerated the procedure well with no immediate complications.  10:39 PM Dose of Bactrim given prior to discharge.   The patient was urged to return to the Emergency Department urgently with worsening pain, swelling, expanding erythema especially if it streaks away from the affected area, fever, or if they have any other concerns.   Counseled on warm soaks and to perform 2-3 time per day.   The patient was urged to return to the Emergency Department or go to their PCP in 48 hours for wound recheck if the area is not significantly improved.  The patient verbalized understanding and stated agreement with this plan.    MDM   Final diagnoses:  Abscess of leg   Patient with skin abscess amenable to incision and drainage. There is surrounding cellulitis so antibiotics were given.   I personally performed the services described in this documentation, which was scribed in my presence. The recorded information has been reviewed and is accurate.    Renne CriglerJoshua Lashawnna Lambrecht, PA-C 01/26/16 2241  Lyndal Pulleyaniel Knott, MD 01/27/16 [phone]

## 2016-08-11 ENCOUNTER — Encounter (HOSPITAL_BASED_OUTPATIENT_CLINIC_OR_DEPARTMENT_OTHER): Payer: Self-pay

## 2016-08-11 ENCOUNTER — Emergency Department (HOSPITAL_BASED_OUTPATIENT_CLINIC_OR_DEPARTMENT_OTHER)
Admission: EM | Admit: 2016-08-11 | Discharge: 2016-08-11 | Disposition: A | Discharge status: Home / Self Care | Payer: Medicaid Other | Attending: Emergency Medicine | Admitting: Emergency Medicine

## 2016-08-11 DIAGNOSIS — J029 Acute pharyngitis, unspecified: Secondary | ICD-10-CM | POA: Diagnosis not present

## 2016-08-11 LAB — RAPID STREP SCREEN (MED CTR MEBANE ONLY): STREPTOCOCCUS, GROUP A SCREEN (DIRECT): NEGATIVE

## 2016-08-11 NOTE — Discharge Instructions (Signed)
Continue to stay well-hydrated. Gargle warm salt water and spit it out, and Use chloraseptic spray or throat lozenges as needed for sore throat. Continue to alternate between Tylenol and Ibuprofen for pain or fever. Use Mucinex for cough suppression/expectoration of mucus. Use netipot and flonase to help with nasal congestion. May consider over-the-counter Benadryl or other antihistamine to decrease secretions and for help with your symptoms. Follow up with your primary care doctor in 5-7 days for recheck of ongoing symptoms. Return to emergency department for emergent changing or worsening of symptoms.

## 2016-08-11 NOTE — ED Provider Notes (Signed)
[facility] Provider Note   CSN: [id number] Arrival date & time: 08/11/16  1623   By signing my name below, I, Clarisse Gouge, attest that this documentation has been prepared under the direction and in the presence of [address] Swiney, [employer]. Electronically Signed: Clarisse Gouge, Scribe. 08/11/16. 5:58 PM.   History   Chief Complaint Chief Complaint  Patient presents with  . Sore Throat   The history is provided by the patient and medical records.  Sore Throat  This is a new problem. The current episode started 2 days ago. The problem occurs constantly. The problem has not changed since onset.Pertinent negatives include no chest pain, no abdominal pain and no shortness of breath. Exacerbated by: laying down. The symptoms are relieved by medications. Treatments tried: dayquil. The treatment provided mild relief.    HPI Comments: Smokey Melott is a 41 y.o. male who presents to the Emergency Department complaining of sore throat x~2.5 days. No other symptoms. States he took dayquil and theraflu with temporary relief. Pain worse with laying down, and relieved when sitting up. Denies fever/chills, rhinorrhea, ear pain/drainage, trismus, drooling, cough, CP, SOB, abd pain, n/v/d/c, dysuria, hematuria, myalgias/arthralgias, numbness, tingling, or focal weakness, or any other complaints at this time. No sick contacts.    Past Medical History:  Diagnosis Date  . Multiple respiratory allergies   . Wrist fracture, right    as a teenager/casted    Patient Active Problem List   Diagnosis Date Noted  . Acute blood loss anemia 03/28/2014  . Pain in joint, lower leg 03/28/2014  . Unspecified constipation 03/28/2014  . Femur fracture (HCC) 03/25/2014  . Closed fracture of femur, distal end (HCC) 03/19/2014    Past Surgical History:  Procedure Laterality Date  . FEMUR IM NAIL Bilateral 03/19/2014   Procedure: Bilateral Intermedullary RETROGRADE FEMORAL NAILING;  Surgeon: Toni Arthurs, MD;  Location: [facility] OR;  Service: Orthopedics;  Laterality: Bilateral;  . MANDIBLE FRACTURE SURGERY         Home Medications    Prior to Admission medications   Medication Sig Start Date End Date Taking? Authorizing Provider  Cholecalciferol (VITAMIN D PO) Take by mouth.    Historical Provider, MD  cyclobenzaprine (FLEXERIL) 10 MG tablet Take 10 mg by mouth 3 (three) times daily as needed for muscle spasms.    Historical Provider, MD  GABAPENTIN PO Take by mouth.    Historical Provider, MD  Oxycodone-Acetaminophen (PERCOCET PO) Take by mouth.    Historical Provider, MD  phentermine 37.5 MG capsule Take 37.5 mg by mouth every morning.    Historical Provider, MD    Family History Family History  Problem Relation Age of Onset  . Diabetes Mother     Social History Social History  Substance Use Topics  . Smoking status: Never Smoker  . Smokeless tobacco: Never Used  . Alcohol use No     Allergies   Patient has no known allergies.   Review of Systems Review of Systems  Constitutional: Negative for chills and fever.  HENT: Positive for sore throat. Negative for drooling, ear discharge, ear pain, rhinorrhea, sinus pressure and trouble swallowing.   Respiratory: Negative for cough and shortness of breath.   Cardiovascular: Negative for chest pain.  Gastrointestinal: Negative for abdominal pain, constipation, diarrhea, nausea and vomiting.  Genitourinary: Negative for dysuria and hematuria.  Musculoskeletal: Negative for arthralgias and myalgias.  Skin: Negative for color change and wound.  Allergic/Immunologic: Negative for immunocompromised state.  Neurological: Negative for weakness  and numbness.  Psychiatric/Behavioral: Negative for confusion.   A complete 10 system review of systems was obtained and all systems are negative except as noted in the HPI and PMH.    Physical Exam Updated Vital Signs BP 124/85   Pulse 107   Temp 98 F (36.7 C) (Oral)   Resp 16    SpO2 99%   Physical Exam  Constitutional: He is oriented to person, place, and time. Vital signs are normal. He appears well-developed and well-nourished.  Non-toxic appearance. No distress.  Afebrile, nontoxic, NAD  HENT:  Head: Normocephalic and atraumatic.  Nose: Nose normal.  Mouth/Throat: Uvula is midline and mucous membranes are normal. No trismus in the jaw. No uvula swelling. Oropharyngeal exudate, posterior oropharyngeal edema and posterior oropharyngeal erythema present. No tonsillar abscesses. Tonsils are 2+ on the right. Tonsils are 2+ on the left. Tonsillar exudate.   Nose clear. Oropharynx injected, without uvular swelling or deviation, no trismus or drooling, 2+ bilateral tonsillar swelling and erythema, +exudates. No PTA.    Eyes: Conjunctivae and EOM are normal. Right eye exhibits no discharge. Left eye exhibits no discharge.  Neck: Normal range of motion. Neck supple.  Cardiovascular: Normal rate and intact distal pulses.   Pulmonary/Chest: Effort normal. No respiratory distress.  Abdominal: Normal appearance. He exhibits no distension.  Musculoskeletal: Normal range of motion.  Lymphadenopathy:       Head (right side): Tonsillar adenopathy present.       Head (left side): Tonsillar adenopathy present.  Bilateral tonsillar LAD.  Neurological: He is alert and oriented to person, place, and time. He has normal strength. No sensory deficit.  Skin: Skin is warm, dry and intact. No rash noted.  Psychiatric: He has a normal mood and affect.  Nursing note and vitals reviewed.    ED Treatments / Results  DIAGNOSTIC STUDIES: Oxygen Saturation is 99% on RA, normal by my interpretation.    COORDINATION OF CARE: 5:58 PM Discussed treatment plan with pt at bedside and pt agreed to plan.  Labs (all labs ordered are listed, but only abnormal results are displayed) Labs Reviewed  RAPID STREP SCREEN (NOT AT [facility])  CULTURE, GROUP A STREP [facility])    EKG  EKG  Interpretation None       Radiology No results found.  Procedures Procedures (including critical care time)  Medications Ordered in ED Medications - No data to display   Initial Impression / Assessment and Plan / ED Course  I have reviewed the triage vital signs and the nursing notes.  Pertinent labs & imaging results that were available during my care of the patient were reviewed by me and considered in my medical decision making (see chart for details).  Will order labs.     41 y.o. male here with sore throat x2.5 days. No other complaints. On exam, tonsils swollen symmetrically and mild exudates and erythema. Mild tonsillar LAD. Will obtain RST. Pt afebrile and nontoxic, handling secretions well, no PTA noted. Pt declines wanting anything for sore throat at this time. Will reassess once RST returns.   6:42 PM RST neg. Likely viral illness. Encouraged OTC meds for symptom control, salt water gargles, and f/up with PCP in 1wk for recheck. I explained the diagnosis and have given explicit precautions to return to the ER including for any other new or worsening symptoms. The patient understands and accepts the medical plan as it's been dictated and I have answered their questions. Discharge instructions concerning home care and prescriptions have been  given. The patient is STABLE and is discharged to home in good condition.   I personally performed the services described in this documentation, which was scribed in my presence. The recorded information has been reviewed and is accurate.   Final Clinical Impressions(s) / ED Diagnoses   Final diagnoses:  Viral pharyngitis  Sore throat    New Prescriptions New Prescriptions   No medications on file     [address]Lakoda Raske Strupp Dima Mini, PA-C 08/11/16 1856    Lavera Guiseana Duo Liu, MD 08/12/16 [phone]

## 2016-08-11 NOTE — ED Triage Notes (Signed)
C/o sore throat x 2 days-NAD-steady gait 

## 2016-08-14 LAB — CULTURE, GROUP A STREP (THRC)

## 2016-09-05 IMAGING — CR DG KNEE COMPLETE 4+V*R*
4 series · 4 of 4 positions shown · non-contrast
Comparison: 03/19/2014

CLINICAL DATA: Right knee pain with flexion for 2 days.  No injury.

EXAM:
RIGHT KNEE - COMPLETE 4+ VIEW

[t knee ap right]
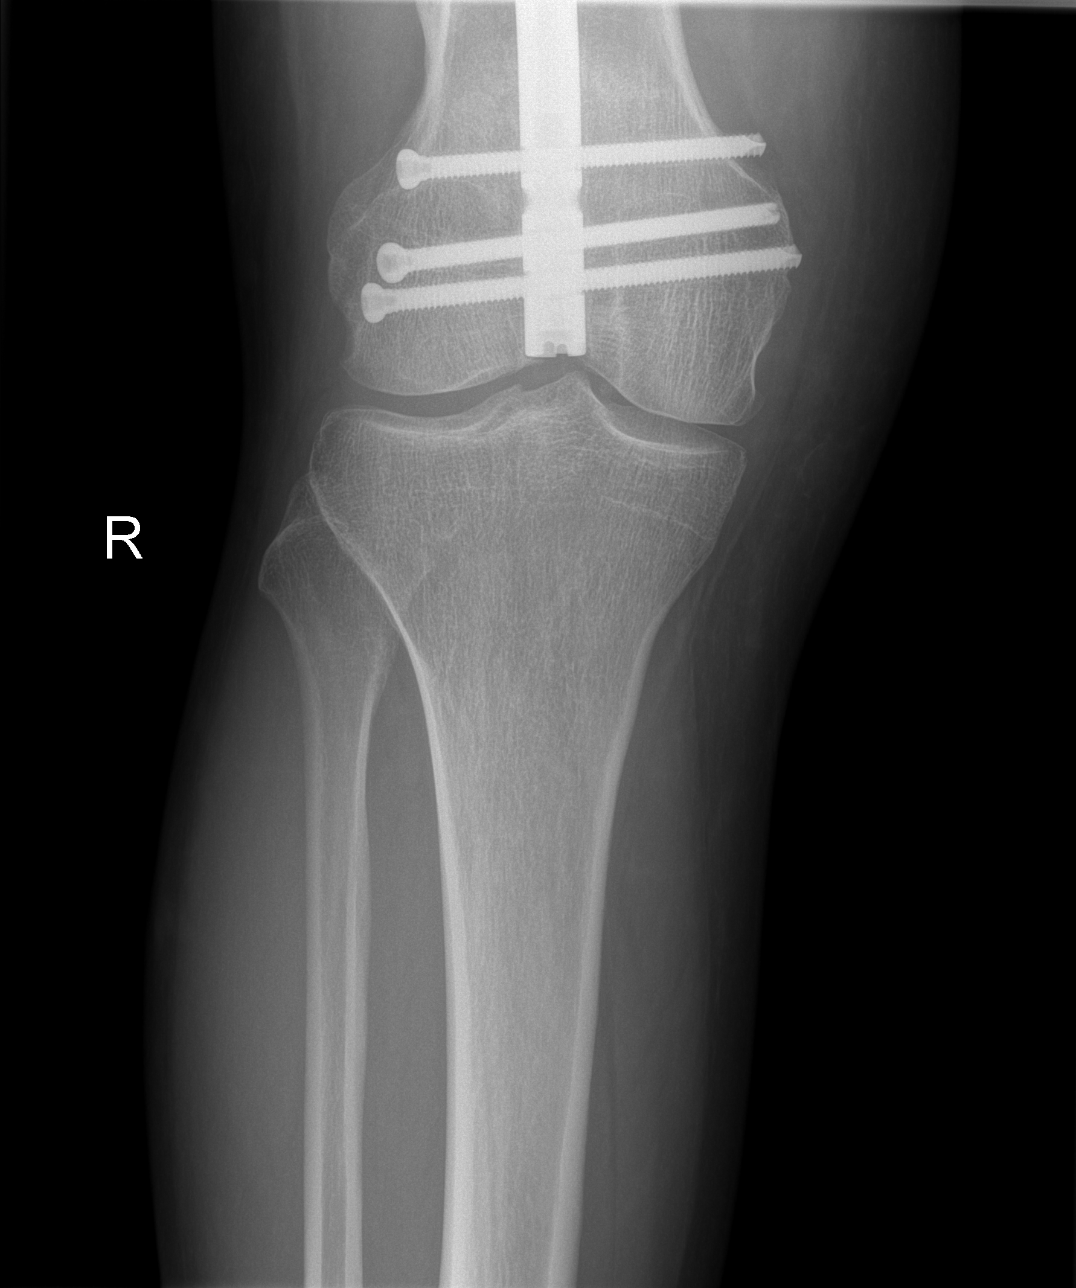

[t knee oblique right (1 of 2)]
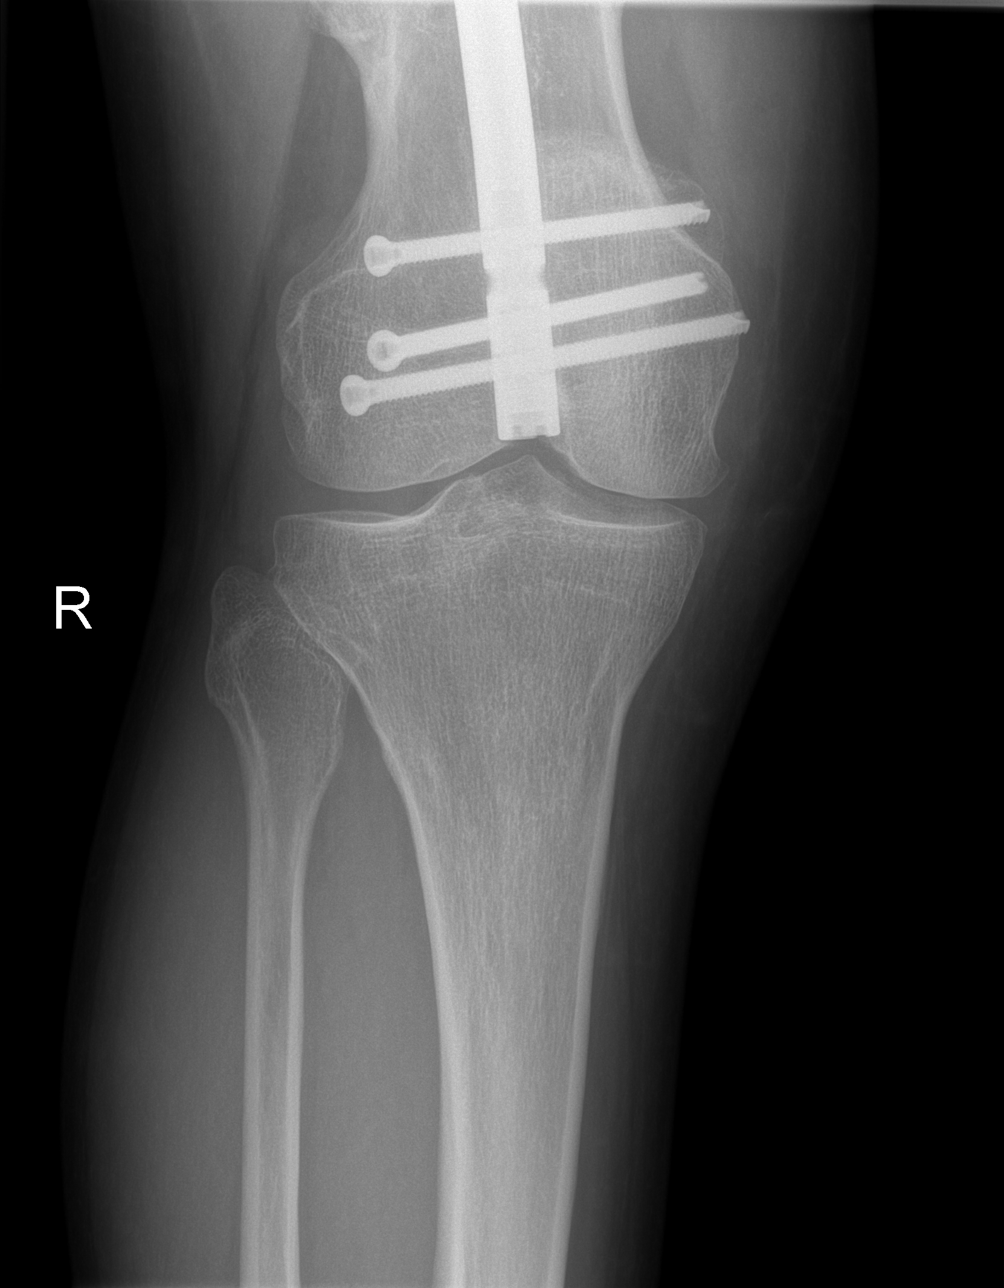

[t knee oblique right (2 of 2)]
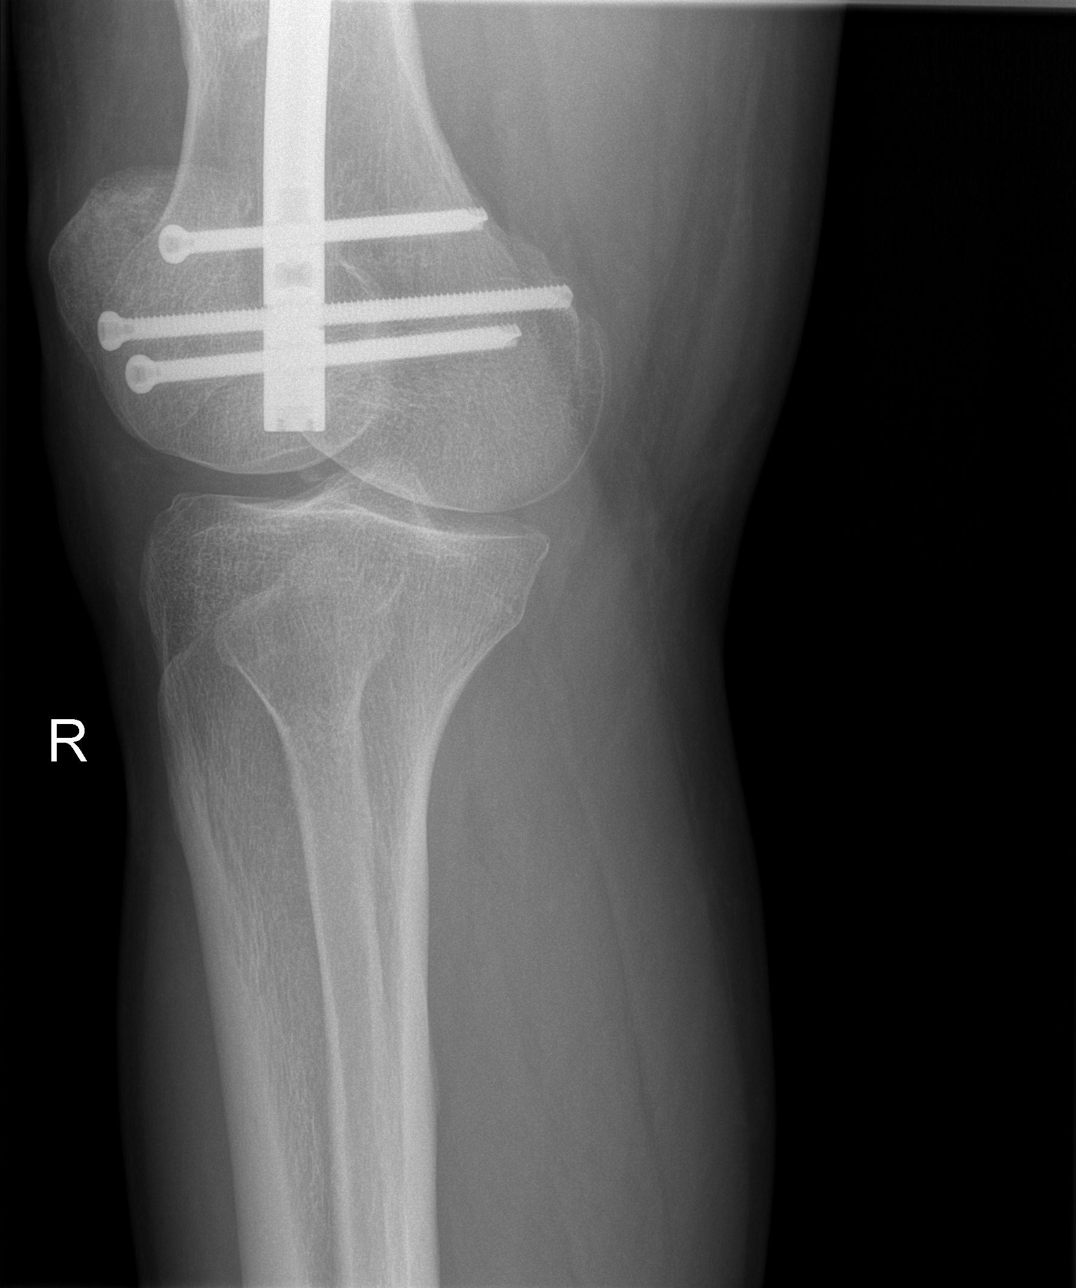

[t knee lat right]
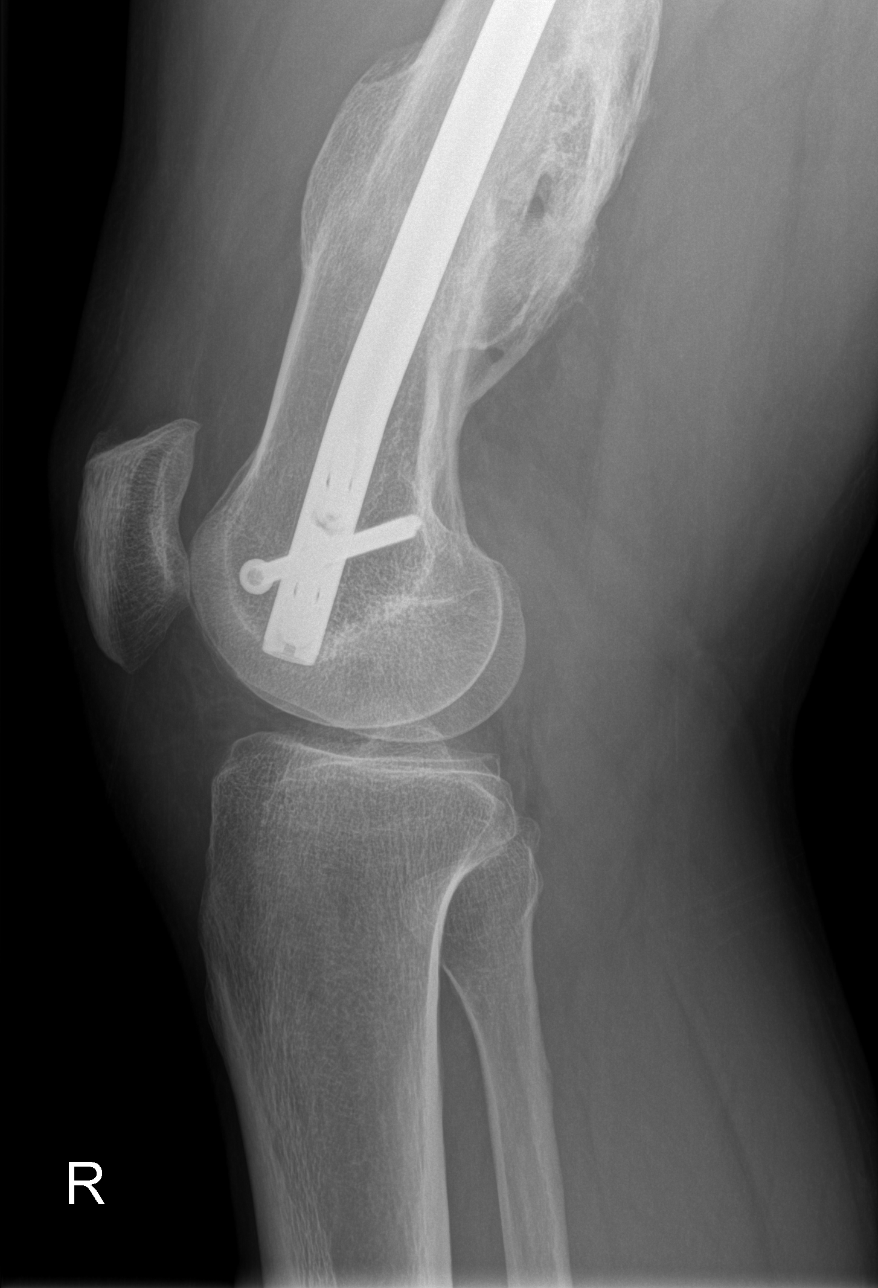

[4 of 4 positions shown; findings below may reference images not displayed]

FINDINGS: Old healed fracture deformity of the distal femoral shaft at the
meta diaphysis. Hardware is not completely visualized but visualized
hardware appears intact. No evidence of acute fracture or
dislocation in the right knee. No focal bone lesion or bone
destruction. No significant effusion.
IMPRESSION: No acute bony abnormalities. Old internal fixation of old healed
fracture deformity of the distal femur.

## 2024-02-12 ENCOUNTER — Encounter (HOSPITAL_BASED_OUTPATIENT_CLINIC_OR_DEPARTMENT_OTHER): Payer: Self-pay

## 2024-02-12 ENCOUNTER — Emergency Department (HOSPITAL_BASED_OUTPATIENT_CLINIC_OR_DEPARTMENT_OTHER)
Admission: EM | Admit: 2024-02-12 | Discharge: 2024-02-12 | Disposition: A | Discharge status: Home / Self Care | Attending: Emergency Medicine | Admitting: Emergency Medicine

## 2024-02-12 ENCOUNTER — Other Ambulatory Visit: Payer: Self-pay

## 2024-02-12 DIAGNOSIS — H00014 Hordeolum externum left upper eyelid: Secondary | ICD-10-CM | POA: Insufficient documentation

## 2024-02-12 DIAGNOSIS — H0289 Other specified disorders of eyelid: Secondary | ICD-10-CM | POA: Diagnosis present

## 2024-02-12 MED ORDER — AMOXICILLIN-POT CLAVULANATE 875-125 MG PO TABS: 1.0000 | ORAL_TABLET | Freq: Two times a day (BID) | ORAL | 0 refills | Status: AC

## 2024-02-12 MED ORDER — DOXYCYCLINE HYCLATE 100 MG PO CAPS: 100.0000 mg | ORAL_CAPSULE | Freq: Two times a day (BID) | ORAL | 0 refills | Status: DC

## 2024-02-12 MED ORDER — DOXYCYCLINE HYCLATE 100 MG PO TABS
100.0000 mg | ORAL_TABLET | Freq: Once | ORAL | Status: AC
  Administered 2024-02-12: 100 mg via ORAL
  Filled 2024-02-12: qty 1

## 2024-02-12 MED ORDER — ONDANSETRON 4 MG PO TBDP: ORAL_TABLET | ORAL | 0 refills | Status: AC

## 2024-02-12 NOTE — Discharge Instructions (Signed)
 Warm compresses at least 4 times a day.  Take the antibiotics as prescribed.  Please follow-up with ophthalmologist in the office.  You need to call them to set up an appointment.

## 2024-02-12 NOTE — ED Notes (Signed)
 Patient called in @8 :4 and stated medication gave at [facility] has made him sick.  Requested Script sent to pharmacy to be changed.  Gave information to ED Physician and original script was discontiued and new script sent to [facility].

## 2024-02-12 NOTE — ED Provider Notes (Signed)
 [place] [facility] AT [facility] Provider Note   CSN: [id number] Arrival date & time: 02/12/24  9284     Patient presents with: Lee Perez is a 48 y.o. male.   48 yo M with a chief complaint of a lesion to his left upper eyelid.  He has had 1 of these before but it has been some years ago.  He tried warm compresses and over-the-counter stye drops but without improvement.        Prior to Admission medications   Medication Sig Start Date End Date Taking? Authorizing Provider  doxycycline  (VIBRAMYCIN ) 100 MG capsule Take 1 capsule (100 mg total) by mouth 2 (two) times daily. One po bid x 7 days 02/12/24  Yes Emil Share, DO  Cholecalciferol (VITAMIN D PO) Take by mouth.    [provider]  cyclobenzaprine (FLEXERIL) 10 MG tablet Take 10 mg by mouth 3 (three) times daily as needed for muscle spasms.    [provider]  GABAPENTIN PO Take by mouth.    [provider]  Oxycodone -Acetaminophen  (PERCOCET PO) Take by mouth.    [provider]  phentermine 37.5 MG capsule Take 37.5 mg by mouth every morning.    [provider]  enoxaparin  (LOVENOX ) 40 MG/0.4ML injection Inject 0.4 mLs (40 mg total) into the skin daily. 03/28/14 01/26/16  Maurice Sharlet RAMAN, PA-C    Allergies: Patient has no known allergies.    Review of Systems  Updated Vital Signs BP (!) 146/97 (BP Location: Right Arm)   Pulse 87   Temp 98.2 F (36.8 C) (Oral)   Resp 16   Ht 6' 2 (1.88 m)   Wt 110.2 kg   SpO2 99%   BMI 31.20 kg/m   Physical Exam Vitals and nursing note reviewed.  Constitutional:      Appearance: He is well-developed.  HENT:     Head: Normocephalic and atraumatic.  Eyes:     Pupils: Pupils are equal, round, and reactive to light.     Comments: Focal edema and erythema to the left upper eyelid.  No obvious induration.  Localized to the left upper lid.  No conjunctival injection extraocular motion intact  Neck:      Vascular: No JVD.  Cardiovascular:     Rate and Rhythm: Normal rate and regular rhythm.     Heart sounds: No murmur heard.    No friction rub. No gallop.  Pulmonary:     Effort: No respiratory distress.     Breath sounds: No wheezing.  Abdominal:     General: There is no distension.     Tenderness: There is no abdominal tenderness. There is no guarding or rebound.  Musculoskeletal:        General: Normal range of motion.     Cervical back: Normal range of motion and neck supple.  Skin:    Coloration: Skin is not pale.     Findings: No rash.  Neurological:     Mental Status: He is alert and oriented to person, place, and time.  Psychiatric:        Behavior: Behavior normal.     (all labs ordered are listed, but only abnormal results are displayed) Labs Reviewed - No data to display  EKG: None  Radiology: No results found.   Procedures   Medications Ordered in the ED  doxycycline  (VIBRA -TABS) tablet 100 mg (has no administration in time range)  Medical Decision Making Risk Prescription drug management.   48 yo M with a chief complaint of the lesion to his left upper eyelid.  This has been going on for about 4 days now.  He has had 1 of these before but it has been some years ago.  No obvious ocular involvement.  Localized to the lid.  Will treat as a stye.  Ophthalmology follow-up.  7:34 AM:  I have discussed the diagnosis/risks/treatment options with the patient.  Evaluation and diagnostic testing in the emergency department does not suggest an emergent condition requiring admission or immediate intervention beyond what has been performed at this time.  They will follow up with Optho. We also discussed returning to the ED immediately if new or worsening sx occur. We discussed the sx which are most concerning (e.g., sudden worsening pain, fever, inability to tolerate by mouth) that necessitate immediate return. Medications administered  to the patient during their visit and any new prescriptions provided to the patient are listed below.  Medications given during this visit Medications  doxycycline  (VIBRA -TABS) tablet 100 mg (has no administration in time range)     The patient appears reasonably screen and/or stabilized for discharge and I doubt any other medical condition or other [facility] requiring further screening, evaluation, or treatment in the ED at this time prior to discharge.       Final diagnoses:  Hordeolum externum of left upper eyelid    ED Discharge Orders          Ordered    doxycycline  (VIBRAMYCIN ) 100 MG capsule  2 times daily        02/12/24 0732               Emil Share, DO 02/12/24 [phone]

## 2024-02-12 NOTE — ED Triage Notes (Signed)
 C/o stye to left eye x 3 days. Been doing warm compresses and stye otc ointment. Denies vision changes.
